# Patient Record
Sex: Female | Born: 1975 | Race: White | Hispanic: No | Marital: Married | Smoking: Never smoker
Health system: Southern US, Community
[De-identification: ages and names within clinical notes are randomized; demographics above are authoritative.]

## PROBLEM LIST (undated history)

## (undated) DIAGNOSIS — N200 Calculus of kidney: Secondary | ICD-10-CM

## (undated) DIAGNOSIS — IMO0001 Reserved for inherently not codable concepts without codable children: Secondary | ICD-10-CM

## (undated) DIAGNOSIS — N6019 Diffuse cystic mastopathy of unspecified breast: Secondary | ICD-10-CM

## (undated) DIAGNOSIS — B019 Varicella without complication: Secondary | ICD-10-CM

## (undated) DIAGNOSIS — R030 Elevated blood-pressure reading, without diagnosis of hypertension: Secondary | ICD-10-CM

## (undated) DIAGNOSIS — O09519 Supervision of elderly primigravida, unspecified trimester: Secondary | ICD-10-CM

## (undated) DIAGNOSIS — F411 Generalized anxiety disorder: Secondary | ICD-10-CM

## (undated) DIAGNOSIS — O0990 Supervision of high risk pregnancy, unspecified, unspecified trimester: Secondary | ICD-10-CM

## (undated) DIAGNOSIS — F1010 Alcohol abuse, uncomplicated: Secondary | ICD-10-CM

## (undated) HISTORY — DX: Generalized anxiety disorder: F41.1

## (undated) HISTORY — PX: KNEE SURGERY: SHX244

## (undated) HISTORY — DX: Varicella without complication: B01.9

## (undated) HISTORY — DX: Alcohol abuse, uncomplicated: F10.10

## (undated) HISTORY — DX: Elevated blood-pressure reading, without diagnosis of hypertension: R03.0

## (undated) HISTORY — DX: Reserved for inherently not codable concepts without codable children: IMO0001

## (undated) HISTORY — PX: WISDOM TOOTH EXTRACTION: SHX21

## (undated) HISTORY — DX: Calculus of kidney: N20.0

---
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
## 2003-02-13 HISTORY — PX: COLPOSCOPY: SHX161

## 2011-05-04 ENCOUNTER — Emergency Department: Payer: Self-pay | Admitting: Internal Medicine

## 2015-04-27 ENCOUNTER — Ambulatory Visit (INDEPENDENT_AMBULATORY_CARE_PROVIDER_SITE_OTHER): Payer: Managed Care, Other (non HMO) | Admitting: Primary Care

## 2015-04-27 ENCOUNTER — Encounter: Payer: Self-pay | Admitting: Primary Care

## 2015-04-27 VITALS — BP 104/64 | HR 62 | Temp 98.2°F | Ht 68.0 in | Wt 182.8 lb

## 2015-04-27 DIAGNOSIS — K219 Gastro-esophageal reflux disease without esophagitis: Secondary | ICD-10-CM | POA: Diagnosis not present

## 2015-04-27 DIAGNOSIS — F411 Generalized anxiety disorder: Secondary | ICD-10-CM | POA: Insufficient documentation

## 2015-04-27 DIAGNOSIS — G4700 Insomnia, unspecified: Secondary | ICD-10-CM | POA: Insufficient documentation

## 2015-04-27 MED ORDER — TRAZODONE HCL 50 MG PO TABS: 25.0000 mg | ORAL_TABLET | Freq: Every evening | ORAL | Status: DC | PRN

## 2015-04-27 NOTE — Assessment & Plan Note (Signed)
History of during ETOH abuse in 2014. Has not tried to wean off. Will have her try to come off and use Pepcid PRN for breakthrough symptoms.

## 2015-04-27 NOTE — Progress Notes (Signed)
Subjective:    Patient ID: Cynthia MelnickKatharine S Diaz, female    DOB: [birth year], 40 y.o.   MRN: [id number]  HPI  [name] is a 40 year old female who presents today to establish care and discuss the problems mentioned below. Will obtain old records. Her last physical was 1 year ago.   1) Generalized Anxiety Disorder: Currently managed on Lexapro 10 mg, Buspar 10 mg TID, and Trazodone 50 mg HS. She hardly takes her Buspar as she doesn't feel as though she needs it. She has a prescription for propranolol 20 mg that she's been taking for previous diagnosis of HTN and has found it to have a calming effect. She is taking 10 mg in the morning and 10 mg in the evening. Overall she feels well managed. Denies SI/HI. Her BP in the clinic today is on the lower end of normal. HR of 62.  2) Alcohol Abuse: History of in college years and young adulthood. Her drinking became out of control around 2013 as she would drink only vodka and wasn't eating. She was hospitalized twice in 2014 and went through rehab in 2014. After rehab in 2014 she has been well since. She's been sober for 3 years to date.   During her rehabilitation she was provided with a script for gabapentin due to neuropathy during detox. She was on high doses but is now on 200 mg daily (100 in the morning and 100 in the evening). She does occasionally experience numbness/tingling to her feet.  3) GERD: Managed on omeprazole 20 mg since 2014 during alcohol treatment. She does not have acid reflux symptoms on her medication and hasn't for years. She's not tried to wean off in the past.  Review of Systems  Constitutional: Negative for unexpected weight change.  Respiratory: Negative for shortness of breath.   Cardiovascular: Negative for chest pain.  Musculoskeletal: Negative for myalgias and arthralgias.  Neurological: Positive for numbness. Negative for dizziness and headaches.  Psychiatric/Behavioral:       See HPI       No past medical  history on file.  Social History   Social History  . Marital Status: Single    Spouse Name: N/A  . Number of Children: N/A  . Years of Education: N/A   Occupational History  . Not on file.   Social History Main Topics  . Smoking status: Never Smoker   . Smokeless tobacco: Not on file  . Alcohol Use: No  . Drug Use: No  . Sexual Activity: Not on file   Other Topics Concern  . 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   Enjoys exercising, running, playing soccer, art, gardening.    Past Surgical History  Procedure Laterality Date  . Knee surgery Left   . Wisdom tooth extraction      Family History  Problem Relation Age of Onset  . Arthritis Father   . Hyperlipidemia Father   . Hypertension Father   . Breast cancer Maternal Aunt     Allergies  Allergen Reactions  . Amoxicillin Hives  . Penicillins Hives    No current outpatient prescriptions on file prior to visit.   No current facility-administered medications on file prior to visit.    BP 104/64 mmHg  Pulse 62  Temp(Src) 98.2 F (36.8 C) (Oral)  Ht 5\' 8"  (1.727 m)  Wt 182 lb 12.8 oz (82.918 kg)  BMI 27.80 kg/m2  SpO2 98%  LMP 04/27/2015    Objective:   Physical Exam  Constitutional: She appears well-nourished.  Neck: Neck supple.  Cardiovascular: Normal rate and regular rhythm.   Pulmonary/Chest: Effort normal and breath sounds normal.  Skin: Skin is warm and dry.  Psychiatric: She has a normal mood and affect.          Assessment & Plan:

## 2015-04-27 NOTE — Patient Instructions (Addendum)
We will slowly wean you off of your propranolol. Take 1/4 tablet twice daily for 1 week, then 1/4 tablet once daily for 1 week, then stop.  Use the Buspar as needed for breakthrough anxiety.   Continue Lexapro daily.  Try to stop Omeprazole. For breakthrough acid reflux try Pepcid or Zantac as needed.  I sent refills for Trazodone to your pharmacy.  Please schedule a physical with me within the next 3 months. You may also schedule a lab only appointment 3-4 days prior. We will discuss your lab results in detail during your physical.  It was a pleasure to meet you today! Please don't hesitate to call me with any questions. Welcome to [facility]!

## 2015-04-27 NOTE — Progress Notes (Signed)
Pre visit review using our clinic review tool, if applicable. No additional management support is needed unless otherwise documented below in the visit note. 

## 2015-04-27 NOTE — Assessment & Plan Note (Signed)
Increased stress with wedding planning and is managed on Trazodone HS PRN. She feels this medication helps with sleep and would like refills. Refill provided.

## 2015-04-27 NOTE — Assessment & Plan Note (Signed)
Managed on Lexapro daily and Buspar TID PRN. Doesn't take the Buspar but does use Propranolol BID scheduled. Since HR and BP are on the lower end of normal, will have her wean of propranolol and use Buspar PRN for breakthrough anxiety.  Continue Lexapro.

## 2015-05-03 ENCOUNTER — Other Ambulatory Visit: Payer: Self-pay | Admitting: Primary Care

## 2015-05-03 DIAGNOSIS — Z0000 Encounter for general adult medical examination without abnormal findings: Secondary | ICD-10-CM

## 2015-05-04 ENCOUNTER — Other Ambulatory Visit (INDEPENDENT_AMBULATORY_CARE_PROVIDER_SITE_OTHER): Payer: Managed Care, Other (non HMO)

## 2015-05-04 DIAGNOSIS — Z0000 Encounter for general adult medical examination without abnormal findings: Secondary | ICD-10-CM | POA: Diagnosis not present

## 2015-05-04 LAB — LIPID PANEL
CHOL/HDL RATIO: 3
CHOLESTEROL: 160 mg/dL (ref 0–200)
HDL: 51.3 mg/dL (ref >39.00)
LDL CALC: 95 mg/dL (ref 0–99)
NonHDL: 109.18
TRIGLYCERIDES: 71 mg/dL (ref 0.0–149.0)
VLDL: 14.2 mg/dL (ref 0.0–40.0)

## 2015-05-04 LAB — COMPREHENSIVE METABOLIC PANEL
ALT: 11 U/L (ref 0–35)
AST: 15 U/L (ref 0–37)
Albumin: 4.4 g/dL (ref 3.5–5.2)
Alkaline Phosphatase: 71 U/L (ref 39–117)
BUN: 8 mg/dL (ref 6–23)
CO2: 31 meq/L (ref 19–32)
Calcium: 9.6 mg/dL (ref 8.4–10.5)
Chloride: 101 mEq/L (ref 96–112)
Creatinine, Ser: 0.91 mg/dL (ref 0.40–1.20)
GFR: 72.92 mL/min (ref >60.00)
GLUCOSE: 90 mg/dL (ref 70–99)
POTASSIUM: 3.9 meq/L (ref 3.5–5.1)
Sodium: 138 mEq/L (ref 135–145)
TOTAL PROTEIN: 7.4 g/dL (ref 6.0–8.3)
Total Bilirubin: 0.5 mg/dL (ref 0.2–1.2)

## 2015-05-04 LAB — CBC
HEMATOCRIT: 39.8 % (ref 36.0–46.0)
HEMOGLOBIN: 13.4 g/dL (ref 12.0–15.0)
MCHC: 33.8 g/dL (ref 30.0–36.0)
MCV: 89.3 fl (ref 78.0–100.0)
Platelets: 301 10*3/uL (ref 150.0–400.0)
RBC: 4.45 Mil/uL (ref 3.87–5.11)
RDW: 12.7 % (ref 11.5–15.5)
WBC: 6.2 10*3/uL (ref 4.0–10.5)

## 2015-05-04 LAB — VITAMIN D 25 HYDROXY (VIT D DEFICIENCY, FRACTURES): VITD: 39.78 ng/mL (ref 30.00–100.00)

## 2015-05-04 LAB — TSH: TSH: 1.5 u[IU]/mL (ref 0.35–4.50)

## 2015-05-04 LAB — HEMOGLOBIN A1C: Hgb A1c MFr Bld: 5.7 % (ref 4.6–6.5)

## 2015-05-16 ENCOUNTER — Encounter: Payer: Self-pay | Admitting: Primary Care

## 2015-05-16 ENCOUNTER — Ambulatory Visit (INDEPENDENT_AMBULATORY_CARE_PROVIDER_SITE_OTHER): Payer: Managed Care, Other (non HMO) | Admitting: Primary Care

## 2015-05-16 VITALS — BP 106/70 | HR 52 | Temp 98.1°F | Ht 68.0 in | Wt 183.8 lb

## 2015-05-16 DIAGNOSIS — F411 Generalized anxiety disorder: Secondary | ICD-10-CM | POA: Diagnosis not present

## 2015-05-16 DIAGNOSIS — Z23 Encounter for immunization: Secondary | ICD-10-CM

## 2015-05-16 DIAGNOSIS — H60543 Acute eczematoid otitis externa, bilateral: Secondary | ICD-10-CM | POA: Diagnosis not present

## 2015-05-16 DIAGNOSIS — H60549 Acute eczematoid otitis externa, unspecified ear: Secondary | ICD-10-CM | POA: Insufficient documentation

## 2015-05-16 DIAGNOSIS — Z01419 Encounter for gynecological examination (general) (routine) without abnormal findings: Secondary | ICD-10-CM

## 2015-05-16 DIAGNOSIS — Z0000 Encounter for general adult medical examination without abnormal findings: Secondary | ICD-10-CM | POA: Insufficient documentation

## 2015-05-16 MED ORDER — TRIAMCINOLONE ACETONIDE 0.1 % EX CREA: 1.0000 "application " | TOPICAL_CREAM | Freq: Two times a day (BID) | CUTANEOUS | Status: DC

## 2015-05-16 NOTE — Progress Notes (Signed)
Subjective:    Patient ID: Cynthia Diaz, female    DOB: 1975-05-21, 40 y.o.   MRN: [id number]  HPI  Cynthia Diaz is a 40 year old female who presents today for complete physical.  Immunizations: -Tetanus: Unsure, believes it was over 10 years.  -Influenza: Did not receive. Never completed.   Diet: Endorses a healthy diet. Breakfast: Skips, coffee Lunch: Salad, sushi Dinner: Soups, meat, vegetables Snacks: Pretzels, humus, vegetables and dip, crackers Desserts: Candy, some ice cream, cookies 3-4 times weekly.  Beverages: Water, flavored water  Exercise: Works out 4-5 times weekly for 1-2 hours.  Eye exam: Completed in 2016 Dental exam: Has not completed in several years.  Pap Smear: 1-2 years ago. Normal paps.    Review of Systems  Constitutional: Negative for unexpected weight change.  HENT: Negative for rhinorrhea.   Respiratory: Negative for cough and shortness of breath.   Cardiovascular: Negative for chest pain.  Gastrointestinal: Negative for diarrhea and constipation.  Genitourinary: Negative for difficulty urinating.  Musculoskeletal: Negative for myalgias.       Aching to both knees  Skin:       Eczema to ear canals.   Allergic/Immunologic: Negative for environmental allergies.  Neurological: Negative for dizziness, numbness and headaches.  Psychiatric/Behavioral:       Stable on current regimen.       Past Medical History  Diagnosis Date  . Alcohol abuse   . Chicken pox   . Elevated blood pressure   . Kidney stones   . Generalized anxiety disorder     Social History   Social History  . Marital Status: Single    Spouse Name: N/A  . Number of Children: N/A  . Years of Education: N/A   Occupational History  . Not on file.   Social History Main Topics  . Smoking status: Never Smoker   . Smokeless tobacco: Not on file  . Alcohol Use: No  . Drug Use: No  . Sexual Activity: Not on file   Other Topics Concern  . Not on file   Social  History Narrative   Engaged to be married.   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   Enjoys exercising, running, playing soccer, art, gardening.    Past Surgical History  Procedure Laterality Date  . Knee surgery Left   . Wisdom tooth extraction      Family History  Problem Relation Age of Onset  . Arthritis Father   . Hyperlipidemia Father   . Hypertension Father   . Breast cancer Maternal Aunt     Allergies  Allergen Reactions  . Amoxicillin Hives  . Penicillins Hives    Current Outpatient Prescriptions on File Prior to Visit  Medication Sig Dispense Refill  . busPIRone (BUSPAR) 10 MG tablet Take 10 mg by mouth 3 (three) times daily.    Marland Kitchen escitalopram (LEXAPRO) 10 MG tablet Take 10 mg by mouth daily.    Marland Kitchen gabapentin (NEURONTIN) 100 MG capsule Take 200 mg by mouth 3 (three) times daily.    Marland Kitchen omeprazole (PRILOSEC) 20 MG capsule Take 20 mg by mouth daily.    . propranolol (INDERAL) 20 MG tablet Take 1/2 tablet by mouth twice daily as needed for anxiety    . traZODone (DESYREL) 50 MG tablet Take 0.5-1 tablets (25-50 mg total) by mouth at bedtime as needed for sleep. 30 tablet 4   No current facility-administered medications on file prior to visit.    BP 106/70 mmHg  Pulse 52  Temp(Src) 98.1 F (36.7 C) (Oral)  Ht 5\' 8"  (1.727 m)  Wt 183 lb 12.8 oz (83.371 kg)  BMI 27.95 kg/m2  SpO2 97%  LMP 04/27/2015    Objective:   Physical Exam  Constitutional: She is oriented to person, place, and time. She appears well-nourished.  HENT:  Right Ear: Tympanic membrane and ear canal normal.  Left Ear: Tympanic membrane and ear canal normal.  Nose: Nose normal.  Mouth/Throat: Oropharynx is clear and moist.  Eyes: Conjunctivae and EOM are normal. Pupils are equal, round, and reactive to light.  Neck: Neck supple. No thyromegaly present.  Cardiovascular: Normal rate and regular rhythm.   No murmur heard. Pulmonary/Chest: Effort normal and breath sounds normal. She has no  rales.  Abdominal: Soft. Bowel sounds are normal. There is no tenderness.  Musculoskeletal: Normal range of motion.  Lymphadenopathy:    She has no cervical adenopathy.  Neurological: She is alert and oriented to person, place, and time. She has normal reflexes. No cranial nerve deficit.  Skin: Skin is warm and dry. No rash noted.  Psychiatric: She has a normal mood and affect.          Assessment & Plan:

## 2015-05-16 NOTE — Addendum Note (Signed)
Addended by: Tawnya CrookSAMBATH, Eathon Valade on: 05/16/2015 03:32 PM   Modules accepted: Orders, SmartSet

## 2015-05-16 NOTE — Assessment & Plan Note (Signed)
History of for years. Once managed on triamcinolone, out of med. Refill provided. Mild eczema noted on exam.

## 2015-05-16 NOTE — Patient Instructions (Addendum)
I've sent Triamcinolone cream to your pharmacy to use on your ears. Apply small amount to ears twice daily as needed for itching/irritation.  Start taking a multivitamin to help increase vitamin D levels.  You will be contacted regarding your referral to GYN.  Please let us know if you have not heard back within one week. You can also call [facility] to ask about establishing with Dr. Helmut MusterAlicia Copland.  Your labs look great! Continue your healthy lifestyle as discussed.   Wean off propranolol as discussed as your heart rate is too low.  Start taking Buspar 1/2 tablet twice daily. Continue Lexapro.   Follow up in 6 months for re-evaluation of anxiety.   It was a pleasure to see you today!

## 2015-05-16 NOTE — Assessment & Plan Note (Signed)
Continues taking propranolol, HR today of 52. Discussed importance of weaning off. Will have her start taking Buspar twice daily in addition to Lexapro.  Follow up in 6 months.

## 2015-05-16 NOTE — Assessment & Plan Note (Signed)
Tdap due, provided today. Never get flu shots. Pap UTD, would like to establish with GYN, referral placed. Exam unremarkable. Labs unremarkable. Continue healthy lifestyle, recommended she eat more during meals.  Follow up in 1 year for repeat physical.

## 2015-05-16 NOTE — Progress Notes (Signed)
Pre visit review using our clinic review tool, if applicable. No additional management support is needed unless otherwise documented below in the visit note. 

## 2015-06-27 ENCOUNTER — Other Ambulatory Visit: Payer: Self-pay | Admitting: Primary Care

## 2015-06-27 DIAGNOSIS — F411 Generalized anxiety disorder: Secondary | ICD-10-CM

## 2015-06-27 DIAGNOSIS — G629 Polyneuropathy, unspecified: Secondary | ICD-10-CM

## 2015-06-28 NOTE — Telephone Encounter (Signed)
Electronically refill request. Medications have not been prescribed by Jae DireKate. Last seen for CPE on 05/16/2015. Follow up on 11/15/2015.

## 2015-06-29 NOTE — Addendum Note (Signed)
Addended by: Tawnya CrookSAMBATH, Nikolis Berent on: 06/29/2015 04:38 PM   Modules accepted: Orders

## 2015-06-29 NOTE — Telephone Encounter (Signed)
Called and notified patient of Kate's comments. Patient stated she is not taking propranolol and omeprazole. Pharmacy was not suppose to sent those for refills.  However, patient ask if she can get a refill on the buspirone.

## 2015-06-30 ENCOUNTER — Other Ambulatory Visit: Payer: Self-pay | Admitting: Primary Care

## 2015-06-30 MED ORDER — BUSPIRONE HCL 10 MG PO TABS: 10.0000 mg | ORAL_TABLET | Freq: Two times a day (BID) | ORAL | Status: DC

## 2015-07-01 NOTE — Telephone Encounter (Signed)
Called and notified patient of Kate's comments. Patient stated that she is taking 1/2 tablet 2 times a daily.

## 2015-08-05 ENCOUNTER — Telehealth: Payer: Self-pay | Admitting: Primary Care

## 2015-08-05 NOTE — Telephone Encounter (Signed)
Patient reached out via our website with a question about her blood type.  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 I wanted to check and see if you guys could let me know. That would be fantastic and I thank you so much :)

## 2015-08-05 NOTE — Telephone Encounter (Signed)
Please advise 

## 2015-08-05 NOTE — Telephone Encounter (Signed)
No answer and mailbox is full so unable to leave message.

## 2015-08-05 NOTE — Telephone Encounter (Signed)
This is not something that we routinely check so I have not done so. But with my experience in donating blood they will be happy to check your blood type at the donation center, before you donate, if you do not know your blood type. If you prefer we check your blood type I am happy to do so but this may not be covered by insurance. The donation center should be able to check free of charge.

## 2015-08-08 NOTE — Telephone Encounter (Signed)
Spoken and notified patient of Kate's comments. Patient verbalized understanding. 

## 2015-08-08 NOTE — Telephone Encounter (Signed)
Tried to call patient and could not leave message due to mailbox is full.

## 2015-08-08 NOTE — Telephone Encounter (Signed)
Returned your call.

## 2015-08-26 ENCOUNTER — Other Ambulatory Visit: Payer: Self-pay | Admitting: Primary Care

## 2015-08-29 NOTE — Telephone Encounter (Signed)
Electronically refill request for   cyanocobalamin (,VITAMIN B-12,) 1000 MCG/ML injection    Sig: INJECT 1 ML INTRAMUSCULARLY EVERY 2 WEEKS   Dispense: 6 mL   Refills: 3     Medication have not been prescribed by Jae DireKate. Also is not on patient's current medication list. Last seen on 05/16/2015. Follow up on 11/15/2015.

## 2015-09-05 ENCOUNTER — Encounter: Payer: Self-pay | Admitting: Primary Care

## 2015-09-06 ENCOUNTER — Other Ambulatory Visit: Payer: Self-pay | Admitting: Primary Care

## 2015-09-06 DIAGNOSIS — E538 Deficiency of other specified B group vitamins: Secondary | ICD-10-CM

## 2015-10-24 ENCOUNTER — Other Ambulatory Visit: Payer: Self-pay | Admitting: Primary Care

## 2015-10-24 DIAGNOSIS — G4700 Insomnia, unspecified: Secondary | ICD-10-CM

## 2015-11-15 ENCOUNTER — Ambulatory Visit (INDEPENDENT_AMBULATORY_CARE_PROVIDER_SITE_OTHER): Payer: Managed Care, Other (non HMO) | Admitting: Primary Care

## 2015-11-15 ENCOUNTER — Encounter: Payer: Self-pay | Admitting: Primary Care

## 2015-11-15 VITALS — BP 104/64 | HR 64 | Temp 98.2°F | Ht 68.0 in | Wt 169.8 lb

## 2015-11-15 DIAGNOSIS — H60543 Acute eczematoid otitis externa, bilateral: Secondary | ICD-10-CM

## 2015-11-15 DIAGNOSIS — K219 Gastro-esophageal reflux disease without esophagitis: Secondary | ICD-10-CM

## 2015-11-15 DIAGNOSIS — E538 Deficiency of other specified B group vitamins: Secondary | ICD-10-CM

## 2015-11-15 DIAGNOSIS — R7303 Prediabetes: Secondary | ICD-10-CM | POA: Diagnosis not present

## 2015-11-15 DIAGNOSIS — G4700 Insomnia, unspecified: Secondary | ICD-10-CM

## 2015-11-15 DIAGNOSIS — F411 Generalized anxiety disorder: Secondary | ICD-10-CM

## 2015-11-15 LAB — BASIC METABOLIC PANEL
BUN: 13 mg/dL (ref 6–23)
CHLORIDE: 100 meq/L (ref 96–112)
CO2: 32 meq/L (ref 19–32)
Calcium: 9.9 mg/dL (ref 8.4–10.5)
Creatinine, Ser: 0.9 mg/dL (ref 0.40–1.20)
GFR: 73.66 mL/min (ref >60.00)
GLUCOSE: 98 mg/dL (ref 70–99)
POTASSIUM: 3.9 meq/L (ref 3.5–5.1)
Sodium: 139 mEq/L (ref 135–145)

## 2015-11-15 LAB — VITAMIN B12: Vitamin B-12: 648 pg/mL (ref 211–911)

## 2015-11-15 LAB — HEMOGLOBIN A1C: Hgb A1c MFr Bld: 5.5 % (ref 4.6–6.5)

## 2015-11-15 NOTE — Assessment & Plan Note (Signed)
Stress at work recently, able to manage. Feels well managed on BuSpar and Lexapro. Has not used propanolol in several months.

## 2015-11-15 NOTE — Assessment & Plan Note (Signed)
Has not taken omeprazole in several months given lack of symptoms. Discussed to use Zantac or Pepcid if symptoms return.

## 2015-11-15 NOTE — Progress Notes (Signed)
Subjective:    Patient ID: Cynthia MelnickKatharine S Diaz, female    DOB: 1975/11/12, 40 y.o.   MRN: [id number]  HPI  [name] is a 40 year old female who presents today for follow up.  1) GAD: Currently managed on Buspar 10 mg BID, Lexapro 10 mg. She has not taken her Propranolol in several months. She is running regularly which has helped with her anxiety. She has felt stressed at work but is able to manage well on her own. She has felt days of increased fatigue, some days worse than others. She's not taken her B12 tablets recently and is due for repeat vitamin B12 level. She has lost 20 pounds since her last visit through healthy diet and exercise. Overall she feels well managed on current regimen. Denies SI/HI.  2) Insomnia: Currently managed on Trazodone 50 mg. She is sleeping 8-9 hours of during the night. She does not wake through the night typically. She feels well managed on trazodone.  3) GERD: Previously managed on omeprazole but has not taken several months given lack of symptoms. Weight loss of 20 pounds since last visit through healthy diet and regular exercise. She continues to remain sober.  Review of Systems  Respiratory: Negative for shortness of breath.   Cardiovascular: Negative for chest pain.  Psychiatric/Behavioral: Negative for sleep disturbance and suicidal ideas. The patient is not nervous/anxious.        Stress at work, able to manage well dependently.       Past Medical History:  Diagnosis Date  . Alcohol abuse   . Chicken pox   . Elevated blood pressure   . Generalized anxiety disorder   . Kidney stones      Social History   Social History  . Marital status: Single    Spouse name: N/A  . Number of children: N/A  . Years of education: N/A   Occupational History  . Not on file.   Social History Main Topics  . Smoking status: Never Smoker  . Smokeless tobacco: Not on file  . Alcohol use No  . Drug use: No  . Sexual activity: Not on file   Other  Topics Concern  . 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   Enjoys exercising, running, playing soccer, art, gardening.    Past Surgical History:  Procedure Laterality Date  . KNEE SURGERY Left   . WISDOM TOOTH EXTRACTION      Family History  Problem Relation Age of Onset  . Arthritis Father   . Hyperlipidemia Father   . Hypertension Father   . Breast cancer Maternal Aunt     Allergies  Allergen Reactions  . Amoxicillin Hives  . Penicillins Hives    Current Outpatient Prescriptions on File Prior to Visit  Medication Sig Dispense Refill  . busPIRone (BUSPAR) 10 MG tablet Take 1 tablet (10 mg total) by mouth 2 (two) times daily. 60 tablet 5  . escitalopram (LEXAPRO) 10 MG tablet TAKE 1 TABLET BY MOUTH ONCE DAILY 90 tablet 3  . gabapentin (NEURONTIN) 100 MG capsule TAKE 2 CAPSULES BY MOUTH TWICE A DAY FOR NEUROPATHY 360 capsule 1  . traZODone (DESYREL) 50 MG tablet TAKE 0.5-1 TABLETS (25-50 MG TOTAL) BY MOUTH AT BEDTIME AS NEEDED FOR SLEEP. 30 tablet 1  . propranolol (INDERAL) 20 MG tablet Take 1/2 tablet by mouth twice daily as needed for anxiety    .  triamcinolone cream (KENALOG) 0.1 % Apply 1 application topically 2 (two) times daily. (Patient not taking: Reported on 11/15/2015) 30 g 0   No current facility-administered medications on file prior to visit.     BP 104/64   Pulse 64   Temp 98.2 F (36.8 C) (Oral)   Ht 5\' 8"  (1.727 m)   Wt 169 lb 12.8 oz (77 kg)   LMP 11/10/2015   SpO2 98%   BMI 25.82 kg/m    Objective:   Physical Exam  Constitutional: She appears well-nourished.  Cardiovascular: Normal rate and regular rhythm.   Pulmonary/Chest: Effort normal and breath sounds normal.  Skin: Skin is warm and dry.  Psychiatric: She has a normal mood and affect.          Assessment & Plan:

## 2015-11-15 NOTE — Progress Notes (Signed)
Pre visit review using our clinic review tool, if applicable. No additional management support is needed unless otherwise documented below in the visit note. 

## 2015-11-15 NOTE — Patient Instructions (Addendum)
Complete lab work prior to leaving today. I will notify you of your results once received.   Continue Lexapro, Buspar, and Trazodone as discussed.  Follow up in April 2018 for your annual physical exam or sooner if needed.  It was a pleasure to see you today!

## 2015-11-15 NOTE — Assessment & Plan Note (Signed)
Intermittent. Stable on triamcinolone cream.

## 2015-11-15 NOTE — Assessment & Plan Note (Signed)
Stable on trazodone, continue current regimen 

## 2016-02-13 ENCOUNTER — Other Ambulatory Visit: Payer: Self-pay | Admitting: Primary Care

## 2016-02-13 DIAGNOSIS — G4700 Insomnia, unspecified: Secondary | ICD-10-CM

## 2016-02-13 NOTE — L&D Delivery Note (Signed)
Operative Delivery Note  I was called to the room at 18:04 and arrived shortly after. The patient was complete and pushing with nursing. I was told the heart tones had been down for a while. I examined the patient, she was complete, +2 station. Fetal Heart tone were 90-120. I verbally consented the patient for a vacuum delivery.  Risks and benefits discussed.  Risks include, but are not limited to the risks of anesthesia, bleeding, infection, damage to maternal tissues, fetal cephalhematoma.  There is also the risk of inability to effect vaginal delivery of the head, or shoulder dystocia that cannot be resolved by established maneuvers, leading to the need for emergency cesarean section. She verbally agreed. The vacuum was placed at (+2) station, (LOA) position. Bladder was empty. Anesthesia was found to be adequate. Two pulls were performed with maternal pushing assistance. No pop offs. The vacuum suction was reduced between pulls. Infant was delivered after two pulls without complication. The vacuum was removed. LOA position. The anterior shoulder was delivered with gentle downward guidance. The posterior shoulder was delivered with upward guidance. The infant was placed on maternal chest. The baby was vigorous and cried. Delayed cord clamping was performed.The infants head/face was examined and no bruising noted. The placenta delivered spontaneous and intact. 3 vessel cord. Uterine fundus was firm. Patient had bilateral sulcus and a 2nd degree perineal lacerations. Lacerations were repaired with 2-0 vicryl and 0-Chromic. Persistent bleeding from right sulcus. Vagina packed and pressure applied.    APGAR: 8, 9; weight 6 lb 15 oz (3147 g).   Cord pH: 7.11  Anesthesia:  Epidural Instruments: Kiwi Episiotomy: None Lacerations: 2nd degree Suture Repair: 2.0 vicryl and 0-chromic Est. Blood Loss (mL):  565cc  Cynthia Diaz R Cynthia Diaz 01/20/2017, 8:52 PM

## 2016-02-14 NOTE — Telephone Encounter (Signed)
Ok to refill? Electronically refill request for   traZODone (DESYREL) 50 MG tablet  Last prescribed on 10/24/2015. Last seen on 11/15/2015.

## 2016-03-15 ENCOUNTER — Encounter: Payer: Self-pay | Admitting: Primary Care

## 2016-03-16 ENCOUNTER — Telehealth: Payer: Managed Care, Other (non HMO) | Admitting: Nurse Practitioner

## 2016-03-16 DIAGNOSIS — J111 Influenza due to unidentified influenza virus with other respiratory manifestations: Secondary | ICD-10-CM

## 2016-03-16 MED ORDER — OSELTAMIVIR PHOSPHATE 75 MG PO CAPS: 75.0000 mg | ORAL_CAPSULE | Freq: Two times a day (BID) | ORAL | 0 refills | Status: DC

## 2016-03-16 NOTE — Progress Notes (Signed)

## 2016-04-25 ENCOUNTER — Other Ambulatory Visit: Payer: Self-pay | Admitting: Primary Care

## 2016-04-25 ENCOUNTER — Encounter: Payer: Self-pay | Admitting: Primary Care

## 2016-04-25 DIAGNOSIS — F411 Generalized anxiety disorder: Secondary | ICD-10-CM

## 2016-04-25 DIAGNOSIS — G629 Polyneuropathy, unspecified: Secondary | ICD-10-CM

## 2016-04-25 MED ORDER — BUSPIRONE HCL 10 MG PO TABS: 10.0000 mg | ORAL_TABLET | Freq: Two times a day (BID) | ORAL | 1 refills | Status: DC

## 2016-04-25 MED ORDER — GABAPENTIN 100 MG PO CAPS: ORAL_CAPSULE | ORAL | 1 refills | Status: DC

## 2016-05-04 ENCOUNTER — Other Ambulatory Visit: Payer: Self-pay | Admitting: Primary Care

## 2016-05-04 DIAGNOSIS — Z0000 Encounter for general adult medical examination without abnormal findings: Secondary | ICD-10-CM

## 2016-05-09 ENCOUNTER — Other Ambulatory Visit (INDEPENDENT_AMBULATORY_CARE_PROVIDER_SITE_OTHER): Payer: BLUE CROSS/BLUE SHIELD

## 2016-05-09 DIAGNOSIS — Z0000 Encounter for general adult medical examination without abnormal findings: Secondary | ICD-10-CM

## 2016-05-09 LAB — COMPREHENSIVE METABOLIC PANEL
ALT: 8 U/L (ref 0–35)
AST: 12 U/L (ref 0–37)
Albumin: 4.5 g/dL (ref 3.5–5.2)
Alkaline Phosphatase: 54 U/L (ref 39–117)
BUN: 13 mg/dL (ref 6–23)
CALCIUM: 9.9 mg/dL (ref 8.4–10.5)
CHLORIDE: 104 meq/L (ref 96–112)
CO2: 30 meq/L (ref 19–32)
CREATININE: 0.88 mg/dL (ref 0.40–1.20)
GFR: 75.41 mL/min (ref >60.00)
Glucose, Bld: 105 mg/dL — ABNORMAL HIGH (ref 70–99)
Potassium: 4.3 mEq/L (ref 3.5–5.1)
Sodium: 140 mEq/L (ref 135–145)
Total Bilirubin: 0.4 mg/dL (ref 0.2–1.2)
Total Protein: 6.8 g/dL (ref 6.0–8.3)

## 2016-05-09 LAB — CBC
HCT: 39.5 % (ref 36.0–46.0)
Hemoglobin: 13.2 g/dL (ref 12.0–15.0)
MCHC: 33.5 g/dL (ref 30.0–36.0)
MCV: 91 fl (ref 78.0–100.0)
Platelets: 240 10*3/uL (ref 150.0–400.0)
RBC: 4.34 Mil/uL (ref 3.87–5.11)
RDW: 12.9 % (ref 11.5–15.5)
WBC: 4.8 10*3/uL (ref 4.0–10.5)

## 2016-05-09 LAB — LIPID PANEL
CHOL/HDL RATIO: 3
Cholesterol: 157 mg/dL (ref 0–200)
HDL: 62 mg/dL (ref >39.00)
LDL CALC: 85 mg/dL (ref 0–99)
NonHDL: 94.67
TRIGLYCERIDES: 50 mg/dL (ref 0.0–149.0)
VLDL: 10 mg/dL (ref 0.0–40.0)

## 2016-05-16 ENCOUNTER — Encounter: Payer: Self-pay | Admitting: Primary Care

## 2016-05-16 ENCOUNTER — Encounter: Payer: BLUE CROSS/BLUE SHIELD | Admitting: Primary Care

## 2016-05-16 ENCOUNTER — Ambulatory Visit (INDEPENDENT_AMBULATORY_CARE_PROVIDER_SITE_OTHER): Payer: BLUE CROSS/BLUE SHIELD | Admitting: Primary Care

## 2016-05-16 VITALS — BP 122/72 | HR 65 | Temp 98.2°F | Ht 68.0 in | Wt 160.1 lb

## 2016-05-16 DIAGNOSIS — Z0000 Encounter for general adult medical examination without abnormal findings: Secondary | ICD-10-CM

## 2016-05-16 DIAGNOSIS — H60543 Acute eczematoid otitis externa, bilateral: Secondary | ICD-10-CM | POA: Diagnosis not present

## 2016-05-16 DIAGNOSIS — G4700 Insomnia, unspecified: Secondary | ICD-10-CM | POA: Diagnosis not present

## 2016-05-16 DIAGNOSIS — F411 Generalized anxiety disorder: Secondary | ICD-10-CM | POA: Diagnosis not present

## 2016-05-16 DIAGNOSIS — K219 Gastro-esophageal reflux disease without esophagitis: Secondary | ICD-10-CM

## 2016-05-16 NOTE — Progress Notes (Signed)
Pre visit review using our clinic review tool, if applicable. No additional management support is needed unless otherwise documented below in the visit note. 

## 2016-05-16 NOTE — Assessment & Plan Note (Signed)
Overall stable on Buspar and Lexapro. Feeling overwhelmed at work but able to manage. Denies SI/HI.

## 2016-05-16 NOTE — Assessment & Plan Note (Signed)
Td UTD. Pap UTD, mammogram due, following with GYN and will schedule through GYN. Commended her on healthy lifestyle, continue. Exam unremarkable. Labs unremarkable. Follow up in 1 year for annual exam.

## 2016-05-16 NOTE — Assessment & Plan Note (Signed)
No use of PPI or H2 Blocker in months. Follows healthy diet.

## 2016-05-16 NOTE — Progress Notes (Signed)
Subjective:    Patient ID: Cynthia Diaz, female    DOB: 07-04-1975, 41 y.o.   MRN: [id number]  HPI  Cynthia Diaz is a 41 year old female who presents today for complete physical. Overall doing well, has no complaints today.   Immunizations: -Tetanus: Completed in 2017 -Influenza: Did not complete last season   Diet: She endorses a healthy diet. Breakfast: Skips, coffee, breakfast bar Lunch: Salads Dinner: Meat, vegetables Snacks: Popcorn, pretzels  Desserts: 2-3 times weekly Beverages: Coffee, water, flavored water, sugar free propel, supplemental drink  Exercise: She exercises 3-4 times weekly at the gym Eye exam: Completed 1 year ago, due now. Dental exam: Plans on scheduling soon. Pap Smear: Following with GYN. Pap was in 2017. Mammogram: Never completed. Will schedule through GYN.   Review of Systems  Constitutional: Negative for unexpected weight change.  HENT: Negative for rhinorrhea.   Respiratory: Negative for cough and shortness of breath.   Cardiovascular: Negative for chest pain.  Gastrointestinal: Negative for constipation and diarrhea.  Genitourinary: Negative for difficulty urinating and menstrual problem.  Musculoskeletal: Negative for arthralgias and myalgias.  Skin: Negative for rash.  Allergic/Immunologic: Negative for environmental allergies.  Neurological: Negative for dizziness, numbness and headaches.  Psychiatric/Behavioral:       Overall feeling well managed on current regimen.        Past Medical History:  Diagnosis Date  . Alcohol abuse   . Chicken pox   . Elevated blood pressure   . Generalized anxiety disorder   . Kidney stones      Social History   Social History  . Marital status: Single    Spouse name: N/A  . Number of children: N/A  . Years of education: N/A   Occupational History  . Not on file.   Social History Main Topics  . Smoking status: Never Smoker  . Smokeless tobacco: Never Used  . Alcohol use No  . Drug  use: No  . Sexual activity: Not on file   Other Topics Concern  . 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   Enjoys exercising, running, playing soccer, art, gardening.    Past Surgical History:  Procedure Laterality Date  . KNEE SURGERY Left   . WISDOM TOOTH EXTRACTION      Family History  Problem Relation Age of Onset  . Arthritis Father   . Hyperlipidemia Father   . Hypertension Father   . Breast cancer Maternal Aunt     Allergies  Allergen Reactions  . Amoxicillin Hives  . Penicillins Hives    Current Outpatient Prescriptions on File Prior to Visit  Medication Sig Dispense Refill  . busPIRone (BUSPAR) 10 MG tablet Take 1 tablet (10 mg total) by mouth 2 (two) times daily. 180 tablet 1  . escitalopram (LEXAPRO) 10 MG tablet TAKE 1 TABLET BY MOUTH ONCE DAILY 90 tablet 3  . gabapentin (NEURONTIN) 100 MG capsule TAKE 2 CAPSULES BY MOUTH TWICE A DAY FOR NEUROPATHY 360 capsule 1  . propranolol (INDERAL) 20 MG tablet Take 1/2 tablet by mouth twice daily as needed for anxiety    . traZODone (DESYREL) 50 MG tablet TAKE 0.5-1 TABLETS (25-50 MG TOTAL) BY MOUTH AT BEDTIME AS NEEDED FOR SLEEP. 90 tablet 0  . triamcinolone cream (KENALOG) 0.1 % Apply 1 application topically 2 (two) times daily. (Patient not taking: Reported on 05/16/2016) 30 g 0  No current facility-administered medications on file prior to visit.     BP 122/72   Pulse 65   Temp 98.2 F (36.8 C) (Oral)   Ht  (1.727 m)   Wt 160 lb 1.9 oz (72.6 kg)   LMP 05/16/2016   SpO2 98%   BMI 24.35 kg/m    Objective:   Physical Exam  Constitutional: She is oriented to person, place, and time. She appears well-nourished.  HENT:  Right Ear: Tympanic membrane and ear canal normal.  Left Ear: Tympanic membrane and ear canal normal.  Nose: Nose normal.  Mouth/Throat: Oropharynx is clear and moist.  Eyes: Conjunctivae and EOM are normal. Pupils  are equal, round, and reactive to light.  Neck: Neck supple. No thyromegaly present.  Cardiovascular: Normal rate and regular rhythm.   No murmur heard. Pulmonary/Chest: Effort normal and breath sounds normal. She has no rales.  Abdominal: Soft. Bowel sounds are normal. There is no tenderness.  Musculoskeletal: Normal range of motion.  Lymphadenopathy:    She has no cervical adenopathy.  Neurological: She is alert and oriented to person, place, and time. She has normal reflexes. No cranial nerve deficit.  Skin: Skin is warm and dry. No rash noted.  Psychiatric: She has a normal mood and affect.          Assessment & Plan:

## 2016-05-16 NOTE — Assessment & Plan Note (Signed)
Doing well on Trazodone. Continue same.

## 2016-05-16 NOTE — Assessment & Plan Note (Signed)
No recent flares 

## 2016-05-16 NOTE — Patient Instructions (Signed)
Continue your efforts towards a healthy lifestyle through diet and exercise.   Your labs look wonderful!  Message me through My Chart if you need anything.  Follow up in 1 year. It was a pleasure to see you today!

## 2016-05-21 ENCOUNTER — Other Ambulatory Visit: Payer: Self-pay | Admitting: Primary Care

## 2016-05-21 DIAGNOSIS — G4700 Insomnia, unspecified: Secondary | ICD-10-CM

## 2016-06-17 ENCOUNTER — Other Ambulatory Visit: Payer: Self-pay | Admitting: Primary Care

## 2016-06-17 DIAGNOSIS — F411 Generalized anxiety disorder: Secondary | ICD-10-CM

## 2016-06-23 ENCOUNTER — Other Ambulatory Visit: Payer: Self-pay | Admitting: Primary Care

## 2016-06-23 DIAGNOSIS — F411 Generalized anxiety disorder: Secondary | ICD-10-CM

## 2016-07-02 ENCOUNTER — Encounter: Payer: Self-pay | Admitting: Obstetrics and Gynecology

## 2016-07-02 ENCOUNTER — Ambulatory Visit (INDEPENDENT_AMBULATORY_CARE_PROVIDER_SITE_OTHER): Payer: BLUE CROSS/BLUE SHIELD | Admitting: Obstetrics and Gynecology

## 2016-07-02 DIAGNOSIS — A64 Unspecified sexually transmitted disease: Secondary | ICD-10-CM

## 2016-07-02 DIAGNOSIS — O09529 Supervision of elderly multigravida, unspecified trimester: Secondary | ICD-10-CM

## 2016-07-02 DIAGNOSIS — O09519 Supervision of elderly primigravida, unspecified trimester: Secondary | ICD-10-CM

## 2016-07-02 DIAGNOSIS — Z3A01 Less than 8 weeks gestation of pregnancy: Secondary | ICD-10-CM

## 2016-07-02 HISTORY — DX: Supervision of elderly primigravida, unspecified trimester: O09.519

## 2016-07-02 NOTE — Patient Instructions (Signed)
First Trimester of Pregnancy The first trimester of pregnancy is from week 1 until the end of week 13 (months 1 through 3). A week after a sperm fertilizes an egg, the egg will implant on the wall of the uterus. This embryo will begin to develop into a baby. Genes from you and your partner will form the baby. The female genes will determine whether the baby will be a boy or a girl. At 6-8 weeks, the eyes and face will be formed, and the heartbeat can be seen on ultrasound. At the end of 12 weeks, all the baby's organs will be formed. Now that you are pregnant, you will want to do everything you can to have a healthy baby. Two of the most important things are to get good prenatal care and to follow your health care provider's instructions. Prenatal care is all the medical care you receive before the baby's birth. This care will help prevent, find, and treat any problems during the pregnancy and childbirth. Body changes during your first trimester Your body goes through many changes during pregnancy. The changes vary from woman to woman.  You may gain or lose a couple of pounds at first.  You may feel sick to your stomach (nauseous) and you may throw up (vomit). If the vomiting is uncontrollable, call your health care provider.  You may tire easily.  You may develop headaches that can be relieved by medicines. All medicines should be approved by your health care provider.  You may urinate more often. Painful urination may mean you have a bladder infection.  You may develop heartburn as a result of your pregnancy.  You may develop constipation because certain hormones are causing the muscles that push stool through your intestines to slow down.  You may develop hemorrhoids or swollen veins (varicose veins).  Your breasts may begin to grow larger and become tender. Your nipples may stick out more, and the tissue that surrounds them (areola) may become darker.  Your gums may bleed and may be  sensitive to brushing and flossing.  Dark spots or blotches (chloasma, mask of pregnancy) may develop on your face. This will likely fade after the baby is born.  Your menstrual periods will stop.  You may have a loss of appetite.  You may develop cravings for certain kinds of food.  You may have changes in your emotions from day to day, such as being excited to be pregnant or being concerned that something may go wrong with the pregnancy and baby.  You may have more vivid and strange dreams.  You may have changes in your hair. These can include thickening of your hair, rapid growth, and changes in texture. Some women also have hair loss during or after pregnancy, or hair that feels dry or thin. Your hair will most likely return to normal after your baby is born.  What to expect at prenatal visits During a routine prenatal visit:  You will be weighed to make sure you and the baby are growing normally.  Your blood pressure will be taken.  Your abdomen will be measured to track your baby's growth.  The fetal heartbeat will be listened to between weeks 10 and 14 of your pregnancy.  Test results from any previous visits will be discussed.  Your health care provider may ask you:  How you are feeling.  If you are feeling the baby move.  If you have had any abnormal symptoms, such as leaking fluid, bleeding, severe headaches,   or abdominal cramping.  If you are using any tobacco products, including cigarettes, chewing tobacco, and electronic cigarettes.  If you have any questions.  Other tests that may be performed during your first trimester include:  Blood tests to find your blood type and to check for the presence of any previous infections. The tests will also be used to check for low iron levels (anemia) and protein on red blood cells (Rh antibodies). Depending on your risk factors, or if you previously had diabetes during pregnancy, you may have tests to check for high blood  sugar that affects pregnant women (gestational diabetes).  Urine tests to check for infections, diabetes, or protein in the urine.  An ultrasound to confirm the proper growth and development of the baby.  Fetal screens for spinal cord problems (spina bifida) and Down syndrome.  HIV (human immunodeficiency virus) testing. Routine prenatal testing includes screening for HIV, unless you choose not to have this test.  You may need other tests to make sure you and the baby are doing well.  Follow these instructions at home: Medicines  Follow your health care provider's instructions regarding medicine use. Specific medicines may be either safe or unsafe to take during pregnancy.  Take a prenatal vitamin that contains at least 600 micrograms (mcg) of folic acid.  If you develop constipation, try taking a stool softener if your health care provider approves. Eating and drinking  Eat a balanced diet that includes fresh fruits and vegetables, whole grains, good sources of protein such as meat, eggs, or tofu, and low-fat dairy. Your health care provider will help you determine the amount of weight gain that is right for you.  Avoid raw meat and uncooked cheese. These carry germs that can cause birth defects in the baby.  Eating four or five small meals rather than three large meals a day may help relieve nausea and vomiting. If you start to feel nauseous, eating a few soda crackers can be helpful. Drinking liquids between meals, instead of during meals, also seems to help ease nausea and vomiting.  Limit foods that are high in fat and processed sugars, such as fried and sweet foods.  To prevent constipation: ? Eat foods that are high in fiber, such as fresh fruits and vegetables, whole grains, and beans. ? Drink enough fluid to keep your urine clear or pale yellow. Activity  Exercise only as directed by your health care provider. Most women can continue their usual exercise routine during  pregnancy. Try to exercise for 30 minutes at least 5 days a week. Exercising will help you: ? Control your weight. ? Stay in shape. ? Be prepared for labor and delivery.  Experiencing pain or cramping in the lower abdomen or lower back is a good sign that you should stop exercising. Check with your health care provider before continuing with normal exercises.  Try to avoid standing for long periods of time. Move your legs often if you must stand in one place for a long time.  Avoid heavy lifting.  Wear low-heeled shoes and practice good posture.  You may continue to have sex unless your health care provider tells you not to. Relieving pain and discomfort  Wear a good support bra to relieve breast tenderness.  Take warm sitz baths to soothe any pain or discomfort caused by hemorrhoids. Use hemorrhoid cream if your health care provider approves.  Rest with your legs elevated if you have leg cramps or low back pain.  If you develop   varicose veins in your legs, wear support hose. Elevate your feet for 15 minutes, 3-4 times a day. Limit salt in your diet. Prenatal care  Schedule your prenatal visits by the twelfth week of pregnancy. They are usually scheduled monthly at first, then more often in the last 2 months before delivery.  Write down your questions. Take them to your prenatal visits.  Keep all your prenatal visits as told by your health care provider. This is important. Safety  Wear your seat belt at all times when driving.  Make a list of emergency phone numbers, including numbers for family, friends, the hospital, and police and fire departments. General instructions  Ask your health care provider for a referral to a local prenatal education class. Begin classes no later than the beginning of month 6 of your pregnancy.  Ask for help if you have counseling or nutritional needs during pregnancy. Your health care provider can offer advice or refer you to specialists for help  with various needs.  Do not use hot tubs, steam rooms, or saunas.  Do not douche or use tampons or scented sanitary pads.  Do not cross your legs for long periods of time.  Avoid cat litter boxes and soil used by cats. These carry germs that can cause birth defects in the baby and possibly loss of the fetus by miscarriage or stillbirth.  Avoid all smoking, herbs, alcohol, and medicines not prescribed by your health care provider. Chemicals in these products affect the formation and growth of the baby.  Do not use any products that contain nicotine or tobacco, such as cigarettes and e-cigarettes. If you need help quitting, ask your health care provider. You may receive counseling support and other resources to help you quit.  Schedule a dentist appointment. At home, brush your teeth with a soft toothbrush and be gentle when you floss. Contact a health care provider if:  You have dizziness.  You have mild pelvic cramps, pelvic pressure, or nagging pain in the abdominal area.  You have persistent nausea, vomiting, or diarrhea.  You have a bad smelling vaginal discharge.  You have pain when you urinate.  You notice increased swelling in your face, hands, legs, or ankles.  You are exposed to fifth disease or chickenpox.  You are exposed to German measles (rubella) and have never had it. Get help right away if:  You have a fever.  You are leaking fluid from your vagina.  You have spotting or bleeding from your vagina.  You have severe abdominal cramping or pain.  You have rapid weight gain or loss.  You vomit blood or material that looks like coffee grounds.  You develop a severe headache.  You have shortness of breath.  You have any kind of trauma, such as from a fall or a car accident. Summary  The first trimester of pregnancy is from week 1 until the end of week 13 (months 1 through 3).  Your body goes through many changes during pregnancy. The changes vary from  woman to woman.  You will have routine prenatal visits. During those visits, your health care provider will examine you, discuss any test results you may have, and talk with you about how you are feeling. This information is not intended to replace advice given to you by your health care provider. Make sure you discuss any questions you have with your health care provider. Document Released: 01/23/2001 Document Revised: 01/11/2016 Document Reviewed: 01/11/2016 Elsevier Interactive Patient Education  2017 Elsevier   Inc.  

## 2016-07-02 NOTE — Progress Notes (Signed)
New Obstetric Patient H&P   Chief Complaint: "Desires prenatal care"   History of Present Illness: Patient is a 41 y.o. G1P0000 Not Hispanic or Latino female, LMP 05/16/16 presents with amenorrhea and positive home pregnancy test. Based on her  LMP, her EDD is 02/20/17 and her EGA is 7726w5d. Cycles are regular monthly. Her last pap smear was 1 year ago (06/01/2015) and was ASCUS with NEGATIVE high risk HPV.    She had a urine pregnancy test which was positive 2 week(s)  ago. Her last menstrual period was slightly shorter and less heavy. Since her LMP she claims she has experienced fatigue, breast tenderness, and some mild nausea. She denies vaginal bleeding. Her past medical history is noncontributory. Her prior pregnancies are notable for none  Since her LMP, she admits to the use of tobacco products  no She claims she has gained   no pounds since the start of her pregnancy.  There are cats in the home in the home  yes If yes Indoor She admits close contact with children on a regular basis  no  She has had chicken pox in the past yes She has had Tuberculosis exposures, symptoms, or previously tested positive for TB   no Current or past history of domestic violence. no  Genetic Screening/Teratology Counseling: (Includes patient, baby's father, or anyone in either family with:)   1. [facility]  yes 2. Thalassemia ([place], [place], Mediterranean, or Asian background): MCV<80  no 3. Neural tube defect (meningomyelocele, spina bifida, anencephaly)  no 4. Congenital heart defect  no  5. Down syndrome  no 6. Tay-Sachs (Jewish, [place])  no 7. Canavan's Disease  no 8. Sickle cell disease or trait (African)  no  9. Hemophilia or other blood disorders  no  10. Muscular dystrophy  no  11. Cystic fibrosis  no  12. Huntington's Chorea  no  13. Mental retardation/autism  no 14. Other inherited genetic or chromosomal disorder  no 15. Maternal metabolic disorder (DM,  PKU, etc)  no 16. Patient or FOB with a child with a birth defect not listed above no  16a. Patient or FOB with a birth defect themselves no 17. Recurrent pregnancy loss, or stillbirth  no  18. Any medications since LMP other than prenatal vitamins (include vitamins, supplements, OTC meds, drugs, alcohol)  no 19. Any other genetic/environmental exposure to discuss  no  Infection History:   1. Lives with someone with TB or TB exposed  no  2. Patient or partner has history of genital herpes  no 3. Rash or viral illness since LMP  no 4. History of STI (GC, CT, HPV, syphilis, HIV)  no 5. History of recent travel :  no  Other pertinent information:  no     Review of Systems:10 point review of systems negative unless otherwise noted in HPI  Past Medical History:  Past Medical History:  Diagnosis Date  . Alcohol abuse   . Chicken pox   . Elevated blood pressure   . Generalized anxiety disorder   . Kidney stones     Past Surgical History:  Past Surgical History:  Procedure Laterality Date  . COLPOSCOPY  2005  . KNEE SURGERY Left   . WISDOM TOOTH EXTRACTION      Gynecologic History: Patient's last menstrual period was 05/16/2016.  Obstetric History: G1P0000  Family History:  Family History  Problem Relation Age of Onset  . Arthritis Father   .  Hyperlipidemia Father   . Hypertension Father   . Breast cancer Maternal Aunt     Social History:  Social History   Social History  . Marital status: Single    Spouse name: N/A  . Number of children: N/A  . Years of education: N/A   Occupational History  . Not on file.   Social History Main Topics  . Smoking status: Never Smoker  . Smokeless tobacco: Never Used  . Alcohol use No  . Drug use: No  . Sexual activity: Yes   Other Topics Concern  . 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   Enjoys exercising, running, playing soccer, art, gardening.      Allergies:  Allergies  Allergen Reactions  . Amoxicillin Hives  . Penicillins Hives    Medications: Prior to Admission medications   Medication Sig Start Date End Date Taking? Authorizing Provider  busPIRone (BUSPAR) 10 MG tablet Take 1 tablet (10 mg total) by mouth 2 (two) times daily. 04/25/16  Yes Doreene Nest, NP  escitalopram (LEXAPRO) 10 MG tablet TAKE 1 TABLET BY MOUTH ONCE DAILY 06/18/16  Yes Doreene Nest, NP  gabapentin (NEURONTIN) 100 MG capsule TAKE 2 CAPSULES BY MOUTH TWICE A DAY FOR NEUROPATHY 04/25/16  Yes Doreene Nest, NP  traZODone (DESYREL) 50 MG tablet TAKE 0.5-1 TABLETS (25-50 MG TOTAL) BY MOUTH AT BEDTIME AS NEEDED FOR SLEEP. 05/21/16  Yes Doreene Nest, NP  triamcinolone cream (KENALOG) 0.1 % Apply 1 application topically 2 (two) times daily. 05/16/15  Yes Doreene Nest, NP    Physical Exam Vitals: Last menstrual period 05/16/2016.  General: NAD HEENT: normocephalic, anicteric Thyroid: no enlargement, no palpable nodules Pulmonary: No increased work of breathing, CTAB Cardiovascular: RRR, distal pulses 2+ Abdomen: NABS, soft, non-tender, non-distended.  Umbilicus without lesions.  No hepatomegaly, splenomegaly or masses palpable. No evidence of hernia  Genitourinary:  External: Normal external female genitalia.  Normal urethral meatus, normal  Bartholin's and Skene's glands.    Vagina: Normal vaginal mucosa, no evidence of prolapse.    Cervix: Grossly normal in appearance, no bleeding  Uterus: Non-enlarged, mobile, normal contour.  No CMT  Adnexa: ovaries non-enlarged, no adnexal masses  Rectal: deferred Extremities: no edema, erythema, or tenderness Neurologic: Grossly intact Psychiatric: mood appropriate, affect full   Assessment: 41 y.o. G1P0000 at [redacted]w[redacted]d presenting to initiate prenatal care  Plan: 1) Avoid alcoholic beverages. 2) Patient encouraged not to smoke.  3) Discontinue the use of all non-medicinal drugs and  chemicals.  4) Take prenatal vitamins daily.  5) Nutrition, food safety (fish, cheese advisories, and high nitrite foods) and exercise discussed. 6) Hospital and practice style discussed with cross coverage system.  7) Genetic Screening, such as with 1st Trimester Screening, cell free fetal DNA, AFP testing, and Ultrasound, as well as with amniocentesis and CVS as appropriate, is discussed with patient. At the conclusion of today's visit patient requested genetic testing 8) Patient is asked about travel to areas at risk for the Zika virus, and counseled to avoid travel and exposure to mosquitoes or sexual partners who may have themselves been exposed to the virus. Testing is discussed, and will be ordered as appropriate.

## 2016-07-03 LAB — RPR+RH+ABO+RUB AB+AB SCR+CB...
Antibody Screen: NEGATIVE
HEMOGLOBIN: 12.9 g/dL (ref 11.1–15.9)
HIV Screen 4th Generation wRfx: NONREACTIVE
Hematocrit: 37.3 % (ref 34.0–46.6)
Hepatitis B Surface Ag: NEGATIVE
MCH: 30.2 pg (ref 26.6–33.0)
MCHC: 34.6 g/dL (ref 31.5–35.7)
MCV: 87 fL (ref 79–97)
PLATELETS: 283 10*3/uL (ref 150–379)
RBC: 4.27 x10E6/uL (ref 3.77–5.28)
RDW: 12.4 % (ref 12.3–15.4)
RPR Ser Ql: NONREACTIVE
Rh Factor: POSITIVE
Rubella Antibodies, IGG: 1.9 index (ref >0.99)
VARICELLA: 983 {index} (ref >165)
WBC: 7.2 10*3/uL (ref 3.4–10.8)

## 2016-07-04 LAB — GC/CHLAMYDIA PROBE AMP
Chlamydia trachomatis, NAA: NEGATIVE
Neisseria gonorrhoeae by PCR: NEGATIVE

## 2016-07-04 LAB — URINE CULTURE

## 2016-07-07 ENCOUNTER — Encounter: Payer: Self-pay | Admitting: Obstetrics and Gynecology

## 2016-07-11 ENCOUNTER — Ambulatory Visit (INDEPENDENT_AMBULATORY_CARE_PROVIDER_SITE_OTHER): Payer: BLUE CROSS/BLUE SHIELD | Admitting: Advanced Practice Midwife

## 2016-07-11 ENCOUNTER — Ambulatory Visit (INDEPENDENT_AMBULATORY_CARE_PROVIDER_SITE_OTHER): Payer: BLUE CROSS/BLUE SHIELD

## 2016-07-11 VITALS — BP 120/56 | Wt 162.0 lb

## 2016-07-11 DIAGNOSIS — Z3A01 Less than 8 weeks gestation of pregnancy: Secondary | ICD-10-CM

## 2016-07-11 DIAGNOSIS — Z362 Encounter for other antenatal screening follow-up: Secondary | ICD-10-CM | POA: Diagnosis not present

## 2016-07-11 DIAGNOSIS — O09529 Supervision of elderly multigravida, unspecified trimester: Secondary | ICD-10-CM

## 2016-07-11 DIAGNOSIS — Z3A12 12 weeks gestation of pregnancy: Secondary | ICD-10-CM

## 2016-07-11 DIAGNOSIS — O0990 Supervision of high risk pregnancy, unspecified, unspecified trimester: Secondary | ICD-10-CM

## 2016-07-11 DIAGNOSIS — Z1379 Encounter for other screening for genetic and chromosomal anomalies: Secondary | ICD-10-CM

## 2016-07-11 HISTORY — DX: Supervision of high risk pregnancy, unspecified, unspecified trimester: O09.90

## 2016-07-11 NOTE — Progress Notes (Signed)
Dating scan today baby is 5134w5d. Due date is adjusted to 01/18/2017. Nausea is subsiding and patient is generally feeling well. Informaseq today.

## 2016-07-11 NOTE — Progress Notes (Signed)
Dating scan today.  

## 2016-07-17 LAB — INFORMASEQ(SM) WITH XY ANALYSIS
Fetal Fraction (%):: 8.1
Fetal Number: 1
Gestational Age at Collection: 12.4 weeks
WEIGHT: 162 [lb_av]

## 2016-07-18 ENCOUNTER — Encounter: Payer: Self-pay | Admitting: Obstetrics and Gynecology

## 2016-07-19 ENCOUNTER — Encounter: Payer: Self-pay | Admitting: Obstetrics and Gynecology

## 2016-08-08 ENCOUNTER — Ambulatory Visit (INDEPENDENT_AMBULATORY_CARE_PROVIDER_SITE_OTHER): Payer: BLUE CROSS/BLUE SHIELD | Admitting: Obstetrics and Gynecology

## 2016-08-08 VITALS — BP 122/62 | Wt 165.0 lb

## 2016-08-08 DIAGNOSIS — O0990 Supervision of high risk pregnancy, unspecified, unspecified trimester: Secondary | ICD-10-CM

## 2016-08-08 DIAGNOSIS — O09519 Supervision of elderly primigravida, unspecified trimester: Secondary | ICD-10-CM

## 2016-08-08 DIAGNOSIS — Z363 Encounter for antenatal screening for malformations: Secondary | ICD-10-CM

## 2016-08-08 DIAGNOSIS — Z3A16 16 weeks gestation of pregnancy: Secondary | ICD-10-CM

## 2016-08-08 NOTE — Progress Notes (Signed)
ROB

## 2016-08-22 ENCOUNTER — Ambulatory Visit (INDEPENDENT_AMBULATORY_CARE_PROVIDER_SITE_OTHER): Payer: BLUE CROSS/BLUE SHIELD | Admitting: Advanced Practice Midwife

## 2016-08-22 ENCOUNTER — Ambulatory Visit (INDEPENDENT_AMBULATORY_CARE_PROVIDER_SITE_OTHER): Payer: BLUE CROSS/BLUE SHIELD

## 2016-08-22 VITALS — BP 118/70 | Wt 167.0 lb

## 2016-08-22 DIAGNOSIS — Z363 Encounter for antenatal screening for malformations: Secondary | ICD-10-CM

## 2016-08-22 DIAGNOSIS — Z3A18 18 weeks gestation of pregnancy: Secondary | ICD-10-CM

## 2016-08-22 NOTE — Progress Notes (Signed)
Anatomy scan complete today. Feeling well- some fatigue. Good fetal movement. Going to the beach next week and reviewed restrictions- avoid rough surf, stay hydrated, etc. Information given on childbirth education classes.

## 2016-08-22 NOTE — Progress Notes (Signed)
No vb. No lof.  

## 2016-09-19 ENCOUNTER — Encounter: Payer: BLUE CROSS/BLUE SHIELD | Admitting: Obstetrics and Gynecology

## 2016-09-26 ENCOUNTER — Ambulatory Visit (INDEPENDENT_AMBULATORY_CARE_PROVIDER_SITE_OTHER): Payer: BLUE CROSS/BLUE SHIELD | Admitting: Advanced Practice Midwife

## 2016-09-26 VITALS — BP 104/62 | Wt 181.0 lb

## 2016-09-26 DIAGNOSIS — Z131 Encounter for screening for diabetes mellitus: Secondary | ICD-10-CM

## 2016-09-26 DIAGNOSIS — Z113 Encounter for screening for infections with a predominantly sexual mode of transmission: Secondary | ICD-10-CM

## 2016-09-26 DIAGNOSIS — Z130 Encounter for screening for diseases of the blood and blood-forming organs and certain disorders involving the immune mechanism: Secondary | ICD-10-CM

## 2016-09-26 DIAGNOSIS — Z3A23 23 weeks gestation of pregnancy: Secondary | ICD-10-CM

## 2016-09-26 NOTE — Progress Notes (Signed)
Patient has questions about weight gain. She says she drinks a lot every day and wonders if that could explain her recent weight gain. She has no edema. She is encouraged to eat healthy foods and stay active. She wonders about the effects of her medications on the baby. Reassurance given that her mental health is important and with some medications risks/benefits are weighed. 28 week labs nv.

## 2016-09-26 NOTE — Progress Notes (Signed)
Pt reports no problems.   

## 2016-10-10 ENCOUNTER — Encounter: Payer: Self-pay | Admitting: Obstetrics and Gynecology

## 2016-10-12 ENCOUNTER — Encounter: Payer: Self-pay | Admitting: Obstetrics and Gynecology

## 2016-10-15 ENCOUNTER — Other Ambulatory Visit: Payer: Self-pay | Admitting: Primary Care

## 2016-10-15 DIAGNOSIS — F411 Generalized anxiety disorder: Secondary | ICD-10-CM

## 2016-10-18 ENCOUNTER — Encounter: Payer: Self-pay | Admitting: Obstetrics and Gynecology

## 2016-10-24 ENCOUNTER — Other Ambulatory Visit: Payer: BLUE CROSS/BLUE SHIELD

## 2016-10-24 ENCOUNTER — Ambulatory Visit (INDEPENDENT_AMBULATORY_CARE_PROVIDER_SITE_OTHER): Payer: BLUE CROSS/BLUE SHIELD | Admitting: Advanced Practice Midwife

## 2016-10-24 VITALS — BP 120/80 | Wt 188.0 lb

## 2016-10-24 DIAGNOSIS — Z131 Encounter for screening for diabetes mellitus: Secondary | ICD-10-CM

## 2016-10-24 DIAGNOSIS — Z3A27 27 weeks gestation of pregnancy: Secondary | ICD-10-CM

## 2016-10-24 DIAGNOSIS — Z113 Encounter for screening for infections with a predominantly sexual mode of transmission: Secondary | ICD-10-CM

## 2016-10-24 DIAGNOSIS — Z130 Encounter for screening for diseases of the blood and blood-forming organs and certain disorders involving the immune mechanism: Secondary | ICD-10-CM

## 2016-10-24 NOTE — Patient Instructions (Signed)
Third Trimester of Pregnancy The third trimester is from week 28 through week 40 (months 7 through 9). The third trimester is a time when the unborn baby (fetus) is growing rapidly. At the end of the ninth month, the fetus is about 20 inches in length and weighs 6-10 pounds. Body changes during your third trimester Your body will continue to go through many changes during pregnancy. The changes vary from woman to woman. During the third trimester:  Your weight will continue to increase. You can expect to gain 25-35 pounds (11-16 kg) by the end of the pregnancy.  You may begin to get stretch marks on your hips, abdomen, and breasts.  You may urinate more often because the fetus is moving lower into your pelvis and pressing on your bladder.  You may develop or continue to have heartburn. This is caused by increased hormones that slow down muscles in the digestive tract.  You may develop or continue to have constipation because increased hormones slow digestion and cause the muscles that push waste through your intestines to relax.  You may develop hemorrhoids. These are swollen veins (varicose veins) in the rectum that can itch or be painful.  You may develop swollen, bulging veins (varicose veins) in your legs.  You may have increased body aches in the pelvis, back, or thighs. This is due to weight gain and increased hormones that are relaxing your joints.  You may have changes in your hair. These can include thickening of your hair, rapid growth, and changes in texture. Some women also have hair loss during or after pregnancy, or hair that feels dry or thin. Your hair will most likely return to normal after your baby is born.  Your breasts will continue to grow and they will continue to become tender. A yellow fluid (colostrum) may leak from your breasts. This is the first milk you are producing for your baby.  Your belly button may stick out.  You may notice more swelling in your hands,  face, or ankles.  You may have increased tingling or numbness in your hands, arms, and legs. The skin on your belly may also feel numb.  You may feel short of breath because of your expanding uterus.  You may have more problems sleeping. This can be caused by the size of your belly, increased need to urinate, and an increase in your body's metabolism.  You may notice the fetus "dropping," or moving lower in your abdomen (lightening).  You may have increased vaginal discharge.  You may notice your joints feel loose and you may have pain around your pelvic bone.  What to expect at prenatal visits You will have prenatal exams every 2 weeks until week 36. Then you will have weekly prenatal exams. During a routine prenatal visit:  You will be weighed to make sure you and the baby are growing normally.  Your blood pressure will be taken.  Your abdomen will be measured to track your baby's growth.  The fetal heartbeat will be listened to.  Any test results from the previous visit will be discussed.  You may have a cervical check near your due date to see if your cervix has softened or thinned (effaced).  You will be tested for Group B streptococcus. This happens between 35 and 37 weeks.  Your health care provider may ask you:  What your birth plan is.  How you are feeling.  If you are feeling the baby move.  If you have had   any abnormal symptoms, such as leaking fluid, bleeding, severe headaches, or abdominal cramping.  If you are using any tobacco products, including cigarettes, chewing tobacco, and electronic cigarettes.  If you have any questions.  Other tests or screenings that may be performed during your third trimester include:  Blood tests that check for low iron levels (anemia).  Fetal testing to check the health, activity level, and growth of the fetus. Testing is done if you have certain medical conditions or if there are problems during the  pregnancy.  Nonstress test (NST). This test checks the health of your baby to make sure there are no signs of problems, such as the baby not getting enough oxygen. During this test, a belt is placed around your belly. The baby is made to move, and its heart rate is monitored during movement.  What is false labor? False labor is a condition in which you feel small, irregular tightenings of the muscles in the womb (contractions) that usually go away with rest, changing position, or drinking water. These are called Braxton Hicks contractions. Contractions may last for hours, days, or even weeks before true labor sets in. If contractions come at regular intervals, become more frequent, increase in intensity, or become painful, you should see your health care provider. What are the signs of labor?  Abdominal cramps.  Regular contractions that start at 10 minutes apart and become stronger and more frequent with time.  Contractions that start on the top of the uterus and spread down to the lower abdomen and back.  Increased pelvic pressure and dull back pain.  A watery or bloody mucus discharge that comes from the vagina.  Leaking of amniotic fluid. This is also known as your "water breaking." It could be a slow trickle or a gush. Let your health care provider know if it has a color or strange odor. If you have any of these signs, call your health care provider right away, even if it is before your due date. Follow these instructions at home: Medicines  Follow your health care provider's instructions regarding medicine use. Specific medicines may be either safe or unsafe to take during pregnancy.  Take a prenatal vitamin that contains at least 600 micrograms (mcg) of folic acid.  If you develop constipation, try taking a stool softener if your health care provider approves. Eating and drinking  Eat a balanced diet that includes fresh fruits and vegetables, whole grains, good sources of protein  such as meat, eggs, or tofu, and low-fat dairy. Your health care provider will help you determine the amount of weight gain that is right for you.  Avoid raw meat and uncooked cheese. These carry germs that can cause birth defects in the baby.  If you have low calcium intake from food, talk to your health care provider about whether you should take a daily calcium supplement.  Eat four or five small meals rather than three large meals a day.  Limit foods that are high in fat and processed sugars, such as fried and sweet foods.  To prevent constipation: ? Drink enough fluid to keep your urine clear or pale yellow. ? Eat foods that are high in fiber, such as fresh fruits and vegetables, whole grains, and beans. Activity  Exercise only as directed by your health care provider. Most women can continue their usual exercise routine during pregnancy. Try to exercise for 30 minutes at least 5 days a week. Stop exercising if you experience uterine contractions.  Avoid heavy   lifting.  Do not exercise in extreme heat or humidity, or at high altitudes.  Wear low-heel, comfortable shoes.  Practice good posture.  You may continue to have sex unless your health care provider tells you otherwise. Relieving pain and discomfort  Take frequent breaks and rest with your legs elevated if you have leg cramps or low back pain.  Take warm sitz baths to soothe any pain or discomfort caused by hemorrhoids. Use hemorrhoid cream if your health care provider approves.  Wear a good support bra to prevent discomfort from breast tenderness.  If you develop varicose veins: ? Wear support pantyhose or compression stockings as told by your healthcare provider. ? Elevate your feet for 15 minutes, 3-4 times a day. Prenatal care  Write down your questions. Take them to your prenatal visits.  Keep all your prenatal visits as told by your health care provider. This is important. Safety  Wear your seat belt at  all times when driving.  Make a list of emergency phone numbers, including numbers for family, friends, the hospital, and police and fire departments. General instructions  Avoid cat litter boxes and soil used by cats. These carry germs that can cause birth defects in the baby. If you have a cat, ask someone to clean the litter box for you.  Do not travel far distances unless it is absolutely necessary and only with the approval of your health care provider.  Do not use hot tubs, steam rooms, or saunas.  Do not drink alcohol.  Do not use any products that contain nicotine or tobacco, such as cigarettes and e-cigarettes. If you need help quitting, ask your health care provider.  Do not use any medicinal herbs or unprescribed drugs. These chemicals affect the formation and growth of the baby.  Do not douche or use tampons or scented sanitary pads.  Do not cross your legs for long periods of time.  To prepare for the arrival of your baby: ? Take prenatal classes to understand, practice, and ask questions about labor and delivery. ? Make a trial run to the hospital. ? Visit the hospital and tour the maternity area. ? Arrange for maternity or paternity leave through employers. ? Arrange for family and friends to take care of pets while you are in the hospital. ? Purchase a rear-facing car seat and make sure you know how to install it in your car. ? Pack your hospital bag. ? Prepare the baby's nursery. Make sure to remove all pillows and stuffed animals from the baby's crib to prevent suffocation.  Visit your dentist if you have not gone during your pregnancy. Use a soft toothbrush to brush your teeth and be gentle when you floss. Contact a health care provider if:  You are unsure if you are in labor or if your water has broken.  You become dizzy.  You have mild pelvic cramps, pelvic pressure, or nagging pain in your abdominal area.  You have lower back pain.  You have persistent  nausea, vomiting, or diarrhea.  You have an unusual or bad smelling vaginal discharge.  You have pain when you urinate. Get help right away if:  Your water breaks before 37 weeks.  You have regular contractions less than 5 minutes apart before 37 weeks.  You have a fever.  You are leaking fluid from your vagina.  You have spotting or bleeding from your vagina.  You have severe abdominal pain or cramping.  You have rapid weight loss or weight gain.    You have shortness of breath with chest pain.  You notice sudden or extreme swelling of your face, hands, ankles, feet, or legs.  Your baby makes fewer than 10 movements in 2 hours.  You have severe headaches that do not go away when you take medicine.  You have vision changes. Summary  The third trimester is from week 28 through week 40, months 7 through 9. The third trimester is a time when the unborn baby (fetus) is growing rapidly.  During the third trimester, your discomfort may increase as you and your baby continue to gain weight. You may have abdominal, leg, and back pain, sleeping problems, and an increased need to urinate.  During the third trimester your breasts will keep growing and they will continue to become tender. A yellow fluid (colostrum) may leak from your breasts. This is the first milk you are producing for your baby.  False labor is a condition in which you feel small, irregular tightenings of the muscles in the womb (contractions) that eventually go away. These are called Braxton Hicks contractions. Contractions may last for hours, days, or even weeks before true labor sets in.  Signs of labor can include: abdominal cramps; regular contractions that start at 10 minutes apart and become stronger and more frequent with time; watery or bloody mucus discharge that comes from the vagina; increased pelvic pressure and dull back pain; and leaking of amniotic fluid. This information is not intended to replace advice  given to you by your health care provider. Make sure you discuss any questions you have with your health care provider. Document Released: 01/23/2001 Document Revised: 07/07/2015 Document Reviewed: 04/01/2012 Elsevier Interactive Patient Education  2017 [employer]  

## 2016-10-24 NOTE — Progress Notes (Signed)
Good fetal movement. Denies Ctx's, LOF, VB. Discussion of normal physiologic changes of third trimester and information given. 28 week labs done today. Patient continues to stay active and hydrated.

## 2016-10-25 LAB — 28 WEEK RH+PANEL
BASOS ABS: 0 10*3/uL (ref 0.0–0.2)
BASOS: 0 %
EOS (ABSOLUTE): 0.3 10*3/uL (ref 0.0–0.4)
Eos: 3 %
GESTATIONAL DIABETES SCREEN: 77 mg/dL (ref 65–139)
HIV SCREEN 4TH GENERATION: NONREACTIVE
Hematocrit: 31.7 % — ABNORMAL LOW (ref 34.0–46.6)
Hemoglobin: 11.1 g/dL (ref 11.1–15.9)
IMMATURE GRANS (ABS): 0 10*3/uL (ref 0.0–0.1)
IMMATURE GRANULOCYTES: 0 %
Lymphocytes Absolute: 1.2 10*3/uL (ref 0.7–3.1)
Lymphs: 13 %
MCH: 31.2 pg (ref 26.6–33.0)
MCHC: 35 g/dL (ref 31.5–35.7)
MCV: 89 fL (ref 79–97)
Monocytes Absolute: 0.7 10*3/uL (ref 0.1–0.9)
Monocytes: 7 %
NEUTROS PCT: 77 %
Neutrophils Absolute: 7 10*3/uL (ref 1.4–7.0)
Platelets: 263 10*3/uL (ref 150–379)
RBC: 3.56 x10E6/uL — AB (ref 3.77–5.28)
RDW: 13.8 % (ref 12.3–15.4)
RPR: NONREACTIVE
WBC: 9.2 10*3/uL (ref 3.4–10.8)

## 2016-11-06 ENCOUNTER — Ambulatory Visit (INDEPENDENT_AMBULATORY_CARE_PROVIDER_SITE_OTHER): Payer: BLUE CROSS/BLUE SHIELD | Admitting: Obstetrics & Gynecology

## 2016-11-06 VITALS — BP 110/70 | Wt 194.0 lb

## 2016-11-06 DIAGNOSIS — O09513 Supervision of elderly primigravida, third trimester: Secondary | ICD-10-CM

## 2016-11-06 DIAGNOSIS — Z3A29 29 weeks gestation of pregnancy: Secondary | ICD-10-CM

## 2016-11-06 DIAGNOSIS — O0990 Supervision of high risk pregnancy, unspecified, unspecified trimester: Secondary | ICD-10-CM

## 2016-11-06 MED ORDER — SERTRALINE HCL 100 MG PO TABS: 100.0000 mg | ORAL_TABLET | Freq: Every day | ORAL | 2 refills | Status: DC

## 2016-11-06 NOTE — Patient Instructions (Addendum)

## 2016-11-06 NOTE — Progress Notes (Signed)
  Subjective  Fetal Movement? yes Contractions? no Leaking Fluid? no Vaginal Bleeding? no  Objective  WEIGHTCHANGE- 30 lb weight gain BP 110/70   Wt 194 lb (88 kg)   LMP 05/16/2016   BMI 29.50 kg/m  General: NAD Pumonary: no increased work of breathing Abdomen: gravid, non-tender Extremities: no edema Psychiatric: mood appropriate, affect full  [facility] Prenatal Labs  Dating [place] Blood type: O/Positive/-- (05/21 1523)   Genetic Screen NIPS: Normal XX Antibody:Negative (05/21 1523)  Anatomic [place] WS Rubella: 1.90 (05/21 1523)  GTT          Third trimester: 77  RPR: Non Reactive (09/12 1553)   Flu vaccine  HBsAg: Negative (05/21 1523)   TDaP vaccine                          Rhogam: HIV:     Baby Food Breast                                              GBS: (For PCN allergy, check sensitivities)  Contraception  Pap:  CS/VBAC    Cord Blood    Support Person        Assessment   41 y.o. G1P0000 at [redacted]w[redacted]d by  01/19/2017, by Ultrasound presenting for routine prenatal visit  Plan   Problem List Items Addressed This Visit      Other   Advanced maternal age, 1st pregnancy, unspecified trimester   Supervision of high risk pregnancy, antepartum - Primary    Other Visit Diagnoses    [redacted] weeks gestation of pregnancy        Breast feeding plans Anxiety discussed.  Change from Lexapro to Zoloft due to slight difference in breast feeding scores.  Buspar PRN. Annamarie Major, MD, [facility], [facility] 11/06/2016  11:54 AM

## 2016-11-21 ENCOUNTER — Ambulatory Visit (INDEPENDENT_AMBULATORY_CARE_PROVIDER_SITE_OTHER): Payer: BLUE CROSS/BLUE SHIELD | Admitting: Obstetrics and Gynecology

## 2016-11-21 VITALS — BP 112/68 | Wt 200.0 lb

## 2016-11-21 DIAGNOSIS — Z23 Encounter for immunization: Secondary | ICD-10-CM | POA: Diagnosis not present

## 2016-11-21 DIAGNOSIS — O09519 Supervision of elderly primigravida, unspecified trimester: Secondary | ICD-10-CM

## 2016-11-21 DIAGNOSIS — O0990 Supervision of high risk pregnancy, unspecified, unspecified trimester: Secondary | ICD-10-CM

## 2016-11-21 DIAGNOSIS — Z3A31 31 weeks gestation of pregnancy: Secondary | ICD-10-CM

## 2016-11-21 NOTE — Progress Notes (Signed)
ROB TDAP not in stock, will get NV

## 2016-12-04 ENCOUNTER — Encounter: Payer: Self-pay | Admitting: Obstetrics and Gynecology

## 2016-12-05 ENCOUNTER — Ambulatory Visit (INDEPENDENT_AMBULATORY_CARE_PROVIDER_SITE_OTHER): Payer: BLUE CROSS/BLUE SHIELD | Admitting: Obstetrics and Gynecology

## 2016-12-05 VITALS — BP 118/76 | Wt 203.0 lb

## 2016-12-05 DIAGNOSIS — O09519 Supervision of elderly primigravida, unspecified trimester: Secondary | ICD-10-CM

## 2016-12-05 DIAGNOSIS — Z3A33 33 weeks gestation of pregnancy: Secondary | ICD-10-CM

## 2016-12-05 DIAGNOSIS — O0990 Supervision of high risk pregnancy, unspecified, unspecified trimester: Secondary | ICD-10-CM

## 2016-12-05 NOTE — Progress Notes (Signed)
Routine Prenatal Care Visit  Subjective  Cynthia Diaz is a 41 y.o. G1P0000 at [redacted]w[redacted]d being seen today for ongoing prenatal care.  She is currently monitored for the following issues for this high-risk pregnancy and has Insomnia; Generalized anxiety disorder; GERD (gastroesophageal reflux disease); Preventative health care; Eczema of external ear; Advanced maternal age, 1st pregnancy, unspecified trimester; and Supervision of high risk pregnancy, antepartum on her problem list.  ----------------------------------------------------------------------------------- Patient reports no complaints.   Contractions: Not present. Vag. Bleeding: None.  Movement: Present. Denies leaking of fluid.  ----------------------------------------------------------------------------------- The following portions of the patient's history were reviewed and updated as appropriate: allergies, current medications, past family history, past medical history, past social history, past surgical history and problem list. Problem list updated.   Objective  Blood pressure 118/76, weight 203 lb (92.1 kg), last menstrual period 05/16/2016. Pregravid weight 162 lb (73.5 kg) Total Weight Gain 41 lb (18.6 kg) Urinalysis: Urine Protein: Negative Urine Glucose: Negative  Fetal Status: Fetal Heart Rate (bpm): 140 Fundal Height: 34 cm Movement: Present     General:  Alert, oriented and cooperative. Patient is in no acute distress.  Skin: Skin is warm and dry. No rash noted.   Cardiovascular: Normal heart rate noted  Respiratory: Normal respiratory effort, no problems with respiration noted  Abdomen: Soft, gravid, appropriate for gestational age. Pain/Pressure: Present     Pelvic:  Cervical exam deferred        Extremities: Normal range of motion.     Mental Status: Normal mood and affect. Normal behavior. Normal judgment and thought content.   Assessment   42 y.o. G1P0000 at [redacted]w[redacted]d by  01/19/2017, by Ultrasound presenting for  routine prenatal visit  Plan   Pregnancy #1 Problems (from 05/12/16 to present)    Problem Noted Resolved   Advanced maternal age, 1st pregnancy, unspecified trimester 07/02/2016 by Vena Austria, MD No   Overview Addendum 12/05/2016  4:07 PM by Conard Novak, MD    Clinic [facility] Prenatal Labs  Dating  Blood type:     Genetic Screen 1 Screen:    AFP:     Quad:     NIPS: Antibody:   Anatomic [place]  Rubella:   Varicella: I  GTT Early:               Third trimester:  RPR:     Rhogam  HBsAg:     TDaP vaccine                       Flu Shot: HIV:     Baby Food                                GBS:   Contraception  Pap: 06/01/15 ASCUS HPV negative  CBB     CS/VBAC    Support Person         [ ]  Needs growth u/s 32-34 weeks [ ]  AP testing 36 weeks w NST [ ]  deliver by 40 weeks       Generalized anxiety disorder 04/27/2015 by Doreene Nest, NP No      Preterm labor symptoms and general obstetric precautions including but not limited to vaginal bleeding, contractions, leaking of fluid and fetal movement were reviewed in detail with the patient. Please refer to After Visit Summary for other counseling recommendations.   Return in about 1 week (around 12/12/2016) for schedule u/s for  growth and routine prenatal.  Thomasene MohairStephen Ayce Pietrzyk, MD  12/05/2016 4:25 PM

## 2016-12-10 ENCOUNTER — Other Ambulatory Visit: Payer: Self-pay | Admitting: Primary Care

## 2016-12-10 DIAGNOSIS — G4700 Insomnia, unspecified: Secondary | ICD-10-CM

## 2016-12-11 NOTE — Addendum Note (Signed)
Addended by: Tawnya CrookSAMBATH, Jaela Yepez on: 12/11/2016 09:07 AM   Modules accepted: Orders

## 2016-12-13 ENCOUNTER — Ambulatory Visit (INDEPENDENT_AMBULATORY_CARE_PROVIDER_SITE_OTHER): Payer: BLUE CROSS/BLUE SHIELD

## 2016-12-13 ENCOUNTER — Ambulatory Visit (INDEPENDENT_AMBULATORY_CARE_PROVIDER_SITE_OTHER): Payer: BLUE CROSS/BLUE SHIELD | Admitting: Maternal Newborn

## 2016-12-13 VITALS — BP 120/80 | HR 84 | Wt 207.0 lb

## 2016-12-13 DIAGNOSIS — O09519 Supervision of elderly primigravida, unspecified trimester: Secondary | ICD-10-CM

## 2016-12-13 DIAGNOSIS — Z362 Encounter for other antenatal screening follow-up: Secondary | ICD-10-CM | POA: Diagnosis not present

## 2016-12-13 DIAGNOSIS — O0990 Supervision of high risk pregnancy, unspecified, unspecified trimester: Secondary | ICD-10-CM

## 2016-12-13 DIAGNOSIS — Z3A34 34 weeks gestation of pregnancy: Secondary | ICD-10-CM

## 2016-12-13 NOTE — Addendum Note (Signed)
Addended by: Liliane ShiSHAW, BEVERLY M on: 12/13/2016 10:13 AM   Modules accepted: Orders

## 2016-12-13 NOTE — Progress Notes (Signed)
Routine Prenatal Care Visit  Subjective  Cynthia Diaz is a 41 y.o. G1P0000 at [redacted]w[redacted]d being seen today for ongoing prenatal care.  She is currently monitored for the following issues for this high-risk pregnancy and has Insomnia; Generalized anxiety disorder; GERD (gastroesophageal reflux disease); Preventative health care; Eczema of external ear; Advanced maternal age, 1st pregnancy, unspecified trimester; and Supervision of high risk pregnancy, antepartum on her problem list.  ----------------------------------------------------------------------------------- Patient reports heart palpitations and joint pain/popping in neck..   Contractions: Not present. Vag. Bleeding: None.  Movement: Present. Denies leaking of fluid.  ----------------------------------------------------------------------------------- The following portions of the patient's history were reviewed and updated as appropriate: allergies, current medications, past family history, past medical history, past social history, past surgical history and problem list. Problem list updated.   Objective  Last menstrual period 05/16/2016. Pregravid weight 162 lb (73.5 kg) Total Weight Gain 45 lb (20.4 kg) Urinalysis: Urine Protein: Negative Urine Glucose: Negative  Fetal Status: Fetal Heart Rate (bpm): 126   Movement: Present     General:  Alert, oriented and cooperative. Patient is in no acute distress.  Skin: Skin is warm and dry. No rash noted.   Cardiovascular: Normal heart rate noted  Respiratory: Normal respiratory effort, no problems with respiration noted  Abdomen: Soft, gravid, appropriate for gestational age. Pain/Pressure: Absent     Pelvic:  Cervical exam deferred        Extremities: Normal range of motion.  Edema: Trace  Mental Status: Normal mood and affect. Normal behavior. Normal judgment and thought content.     Assessment   42 y.o. G1P0000 at [redacted]w[redacted]d by  01/19/2017, by Ultrasound presenting for routine  prenatal visit.  Plan   Pregnancy #1 Problems (from 05/12/16 to present)    Problem Noted Resolved   Advanced maternal age, 1st pregnancy, unspecified trimester 07/02/2016 by Vena Austria, MD No   Overview Addendum 12/05/2016  4:07 PM by Conard Novak, MD    [facility]inic [facility] Prenatal Labs  Dating  Blood type:     Genetic Screen 1 Screen:    AFP:     Quad:     NIPS: Antibody:   Anatomic [place]  Rubella:   Varicella: I  GTT Early:               Third trimester:  RPR:     Rhogam  HBsAg:     TDaP vaccine                       Flu Shot: HIV:     Baby Food                                GBS:   Contraception  Pap: 06/01/15 ASCUS HPV negative  CBB     CS/VBAC    Support Person         [ ]  Needs growth u/s 32-34 weeks [ ]  AP testing 36 weeks w NST [ ]  deliver by 40 weeks       Generalized anxiety disorder 04/27/2015 by Doreene Nest, NP No     Growth ultrasound today, reviewed and normal:    Preterm labor symptoms and general obstetric precautions including but not limited to vaginal bleeding, contractions, leaking of fluid and fetal movement were reviewed in detail with the patient.  Discussed that heart palpitations can be normal in pregnancy but to report any chest pain,  difficulty breathing, or syncope because there would be a low threshold to perform an EKG or Holter monitoring to rule out pathology.  She has an ongoing problem with a joint in her neck; reporting pain and popping sounds. Tylenol and heat are not helping much with pain. Advised alternating with ice and trying massage therapy.  Keep appointment next week; GBS/GC and NST nv.  Return in about 2 weeks (around 12/27/2016) for ROB with NST.  Marcelyn BruinsJacelyn Shamara Soza, CNM 12/13/2016  4:21 PM

## 2016-12-13 NOTE — Progress Notes (Signed)
C/o heart palpitations.

## 2016-12-21 ENCOUNTER — Ambulatory Visit (INDEPENDENT_AMBULATORY_CARE_PROVIDER_SITE_OTHER): Payer: BLUE CROSS/BLUE SHIELD | Admitting: Obstetrics and Gynecology

## 2016-12-21 ENCOUNTER — Encounter: Payer: Self-pay | Admitting: Obstetrics and Gynecology

## 2016-12-21 VITALS — BP 118/74 | Wt 207.0 lb

## 2016-12-21 DIAGNOSIS — Z3A35 35 weeks gestation of pregnancy: Secondary | ICD-10-CM

## 2016-12-21 DIAGNOSIS — O0990 Supervision of high risk pregnancy, unspecified, unspecified trimester: Secondary | ICD-10-CM

## 2016-12-21 DIAGNOSIS — F411 Generalized anxiety disorder: Secondary | ICD-10-CM

## 2016-12-21 DIAGNOSIS — O09519 Supervision of elderly primigravida, unspecified trimester: Secondary | ICD-10-CM

## 2016-12-21 NOTE — Progress Notes (Signed)
Routine Prenatal Care Visit  Subjective  Cynthia Diaz is a 41 y.o. G1P0000 at 5660w6d being seen today for ongoing prenatal care.  She is currently monitored for the following issues for this high-risk pregnancy and has Insomnia; Generalized anxiety disorder; GERD (gastroesophageal reflux disease); Preventative health care; Eczema of external ear; Advanced maternal age, 1st pregnancy, unspecified trimester; and Supervision of high risk pregnancy, antepartum on their problem list.  ----------------------------------------------------------------------------------- Patient reports no complaints.   Contractions: Not present. Vag. Bleeding: None.  Movement: Present. Denies leaking of fluid.  ----------------------------------------------------------------------------------- The following portions of the patient's history were reviewed and updated as appropriate: allergies, current medications, past family history, past medical history, past social history, past surgical history and problem list. Problem list updated.   Objective  Blood pressure 118/74, weight 207 lb (93.9 kg), last menstrual period 05/16/2016. Pregravid weight 162 lb (73.5 kg) Total Weight Gain 45 lb (20.4 kg) Urinalysis: Urine Protein: Negative Urine Glucose: Negative  Fetal Status: Fetal Heart Rate (bpm): 140   Movement: Present     General:  Alert, oriented and cooperative. Patient is in no acute distress.  Skin: Skin is warm and dry. No rash noted.   Cardiovascular: Normal heart rate noted  Respiratory: Normal respiratory effort, no problems with respiration noted  Abdomen: Soft, gravid, appropriate for gestational age. Pain/Pressure: Absent     Pelvic:  Cervical exam deferred        Extremities: Normal range of motion.     Mental Status: Normal mood and affect. Normal behavior. Normal judgment and thought content.   NST: Baseline FHR: 140 beats/min Variability: moderate Accelerations: present Decelerations:  absent Tocometry: not done  Interpretation:  INDICATIONS: advanced maternal age (age > 5340) RESULTS:  A NST procedure was performed with FHR monitoring and a normal baseline established, appropriate time of 20-40 minutes of evaluation, and accels >2 seen w 15x15 characteristics.  Results show a REACTIVE NST.   Assessment   41 y.o. G1P0000 at 8160w6d by  01/19/2017, by Ultrasound presenting for routine prenatal visit  Plan   Pregnancy #1 Problems (from 05/12/16 to present)    Problem Noted Resolved   Advanced maternal age, 1st pregnancy, unspecified trimester 07/02/2016 by Vena AustriaStaebler, Andreas, MD No   Overview Addendum 12/21/2016  3:43 PM by Conard NovakJackson, Kaio Kuhlman D, MD    [facility] Prenatal Labs  Dating  Blood type:     Genetic Screen 1 Screen:    AFP:     Quad:     NIPS: Antibody:   Anatomic US  Rubella:   Varicella: I  GTT Early:               Third trimester:  RPR:     Rhogam  HBsAg:     TDaP vaccine                       Flu Shot: HIV:     Baby Food                                GBS:   Contraception  Pap: 06/01/15 ASCUS HPV negative  CBB     CS/VBAC    Support Person         [x]  Needs growth u/s 32-34 weeks (normal) [ ]  AP testing 36 weeks w NST [ ]  deliver by 40 weeks      Generalized anxiety disorder 04/27/2015 by Doreene Nestlark, Katherine K,  NP No     Preterm labor symptoms and general obstetric precautions including but not limited to vaginal bleeding, contractions, leaking of fluid and fetal movement were reviewed in detail with the patient. Please refer to After Visit Summary for other counseling recommendations.   Return in about 1 week (around 12/28/2016) for schedule routine prenatal with NST.  Thomasene MohairStephen Canna Nickelson, MD  12/21/2016 4:45 PM

## 2016-12-23 LAB — GC/CHLAMYDIA PROBE AMP
Chlamydia trachomatis, NAA: NEGATIVE
NEISSERIA GONORRHOEAE BY PCR: NEGATIVE

## 2016-12-25 LAB — STREP GP B SUSCEPTIBILITY

## 2016-12-25 LAB — STREP GP B CULTURE+RFLX: STREP GP B CULTURE+RFLX: POSITIVE — AB

## 2016-12-28 ENCOUNTER — Encounter: Payer: Self-pay | Admitting: Advanced Practice Midwife

## 2016-12-28 ENCOUNTER — Ambulatory Visit (INDEPENDENT_AMBULATORY_CARE_PROVIDER_SITE_OTHER): Payer: BLUE CROSS/BLUE SHIELD | Admitting: Advanced Practice Midwife

## 2016-12-28 VITALS — BP 118/76 | Wt 209.0 lb

## 2016-12-28 DIAGNOSIS — O0990 Supervision of high risk pregnancy, unspecified, unspecified trimester: Secondary | ICD-10-CM

## 2016-12-28 DIAGNOSIS — Z3A36 36 weeks gestation of pregnancy: Secondary | ICD-10-CM

## 2016-12-28 NOTE — Patient Instructions (Signed)
Vaginal Delivery Vaginal delivery means that you will give birth by pushing your baby out of your birth canal (vagina). A team of health care providers will help you before, during, and after vaginal delivery. Birth experiences are unique for every woman and every pregnancy, and birth experiences vary depending on where you choose to give birth. What should I do to prepare for my baby's birth? Before your baby is born, it is important to talk with your health care provider about:  Your labor and delivery preferences. These may include: ? Medicines that you may be given. ? How you will manage your pain. This might include non-medical pain relief techniques or injectable pain relief such as epidural analgesia. ? How you and your baby will be monitored during labor and delivery. ? Who may be in the labor and delivery room with you. ? Your feelings about surgical delivery of your baby (cesarean delivery, or C-section) if this becomes necessary. ? Your feelings about receiving donated blood through an IV tube (blood transfusion) if this becomes necessary.  Whether you are able: ? To take pictures or videos of the birth. ? To eat during labor and delivery. ? To move around, walk, or change positions during labor and delivery.  What to expect after your baby is born, such as: ? Whether delayed umbilical cord clamping and cutting is offered. ? Who will care for your baby right after birth. ? Medicines or tests that may be recommended for your baby. ? Whether breastfeeding is supported in your hospital or birth center. ? How long you will be in the hospital or birth center.  How any medical conditions you have may affect your baby or your labor and delivery experience.  To prepare for your baby's birth, you should also:  Attend all of your health care visits before delivery (prenatal visits) as recommended by your health care provider. This is important.  Prepare your home for your baby's  arrival. Make sure that you have: ? Diapers. ? Baby clothing. ? Feeding equipment. ? Safe sleeping arrangements for you and your baby.  Install a car seat in your vehicle. Have your car seat checked by a certified car seat installer to make sure that it is installed safely.  Think about who will help you with your new baby at home for at least the first several weeks after delivery.  What can I expect when I arrive at the birth center or hospital? Once you are in labor and have been admitted into the hospital or birth center, your health care provider may:  Review your pregnancy history and any concerns you have.  Insert an IV tube into one of your veins. This is used to give you fluids and medicines.  Check your blood pressure, pulse, temperature, and heart rate (vital signs).  Check whether your bag of water (amniotic sac) has broken (ruptured).  Talk with you about your birth plan and discuss pain control options.  Monitoring Your health care provider may monitor your contractions (uterine monitoring) and your baby's heart rate (fetal monitoring). You may need to be monitored:  Often, but not continuously (intermittently).  All the time or for long periods at a time (continuously). Continuous monitoring may be needed if: ? You are taking certain medicines, such as medicine to relieve pain or make your contractions stronger. ? You have pregnancy or labor complications.  Monitoring may be done by:  Placing a special stethoscope or a handheld monitoring device on your abdomen to   check your baby's heartbeat, and feeling your abdomen for contractions. This method of monitoring does not continuously record your baby's heartbeat or your contractions.  Placing monitors on your abdomen (external monitors) to record your baby's heartbeat and the frequency and length of contractions. You may not have to wear external monitors all the time.  Placing monitors inside of your uterus  (internal monitors) to record your baby's heartbeat and the frequency, length, and strength of your contractions. ? Your health care provider may use internal monitors if he or she needs more information about the strength of your contractions or your baby's heart rate. ? Internal monitors are put in place by passing a thin, flexible wire through your vagina and into your uterus. Depending on the type of monitor, it may remain in your uterus or on your baby's head until birth. ? Your health care provider will discuss the benefits and risks of internal monitoring with you and will ask for your permission before inserting the monitors.  Telemetry. This is a type of continuous monitoring that can be done with external or internal monitors. Instead of having to stay in bed, you are able to move around during telemetry. Ask your health care provider if telemetry is an option for you.  Physical exam Your health care provider may perform a physical exam. This may include:  Checking whether your baby is positioned: ? With the head toward your vagina (head-down). This is most common. ? With the head toward the top of your uterus (head-up or breech). If your baby is in a breech position, your health care provider may try to turn your baby to a head-down position so you can deliver vaginally. If it does not seem that your baby can be born vaginally, your provider may recommend surgery to deliver your baby. In rare cases, you may be able to deliver vaginally if your baby is head-up (breech delivery). ? Lying sideways (transverse). Babies that are lying sideways cannot be delivered vaginally.  Checking your cervix to determine: ? Whether it is thinning out (effacing). ? Whether it is opening up (dilating). ? How low your baby has moved into your birth canal.  What are the three stages of labor and delivery?  Normal labor and delivery is divided into the following three stages: Stage 1  Stage 1 is the  longest stage of labor, and it can last for hours or days. Stage 1 includes: ? Early labor. This is when contractions may be irregular, or regular and mild. Generally, early labor contractions are more than 10 minutes apart. ? Active labor. This is when contractions get longer, more regular, more frequent, and more intense. ? The transition phase. This is when contractions happen very close together, are very intense, and may last longer than during any other part of labor.  Contractions generally feel mild, infrequent, and irregular at first. They get stronger, more frequent (about every 2-3 minutes), and more regular as you progress from early labor through active labor and transition.  Many women progress through stage 1 naturally, but you may need help to continue making progress. If this happens, your health care provider may talk with you about: ? Rupturing your amniotic sac if it has not ruptured yet. ? Giving you medicine to help make your contractions stronger and more frequent.  Stage 1 ends when your cervix is completely dilated to 4 inches (10 cm) and completely effaced. This happens at the end of the transition phase. Stage 2  Once   your cervix is completely effaced and dilated to 4 inches (10 cm), you may start to feel an urge to push. It is common for the body to naturally take a rest before feeling the urge to push, especially if you received an epidural or certain other pain medicines. This rest period may last for up to 1-2 hours, depending on your unique labor experience.  During stage 2, contractions are generally less painful, because pushing helps relieve contraction pain. Instead of contraction pain, you may feel stretching and burning pain, especially when the widest part of your baby's head passes through the vaginal opening (crowning).  Your health care provider will closely monitor your pushing progress and your baby's progress through the vagina during stage 2.  Your  health care provider may massage the area of skin between your vaginal opening and anus (perineum) or apply warm compresses to your perineum. This helps it stretch as the baby's head starts to crown, which can help prevent perineal tearing. ? In some cases, an incision may be made in your perineum (episiotomy) to allow the baby to pass through the vaginal opening. An episiotomy helps to make the opening of the vagina larger to allow more room for the baby to fit through.  It is very important to breathe and focus so your health care provider can control the delivery of your baby's head. Your health care provider may have you decrease the intensity of your pushing, to help prevent perineal tearing.  After delivery of your baby's head, the shoulders and the rest of the body generally deliver very quickly and without difficulty.  Once your baby is delivered, the umbilical cord may be cut right away, or this may be delayed for 1-2 minutes, depending on your baby's health. This may vary among health care providers, hospitals, and birth centers.  If you and your baby are healthy enough, your baby may be placed on your chest or abdomen to help maintain the baby's temperature and to help you bond with each other. Some mothers and babies start breastfeeding at this time. Your health care team will dry your baby and help keep your baby warm during this time.  Your baby may need immediate care if he or she: ? Showed signs of distress during labor. ? Has a medical condition. ? Was born too early (prematurely). ? Had a bowel movement before birth (meconium). ? Shows signs of difficulty transitioning from being inside the uterus to being outside of the uterus. If you are planning to breastfeed, your health care team will help you begin a feeding. Stage 3  The third stage of labor starts immediately after the birth of your baby and ends after you deliver the placenta. The placenta is an organ that develops  during pregnancy to provide oxygen and nutrients to your baby in the womb.  Delivering the placenta may require some pushing, and you may have mild contractions. Breastfeeding can stimulate contractions to help you deliver the placenta.  After the placenta is delivered, your uterus should tighten (contract) and become firm. This helps to stop bleeding in your uterus. To help your uterus contract and to control bleeding, your health care provider may: ? Give you medicine by injection, through an IV tube, by mouth, or through your rectum (rectally). ? Massage your abdomen or perform a vaginal exam to remove any blood clots that are left in your uterus. ? Empty your bladder by placing a thin, flexible tube (catheter) into your bladder. ? Encourage   you to breastfeed your baby. After labor is over, you and your baby will be monitored closely to ensure that you are both healthy until you are ready to go home. Your health care team will teach you how to care for yourself and your baby. This information is not intended to replace advice given to you by your health care provider. Make sure you discuss any questions you have with your health care provider. Document Released: 11/08/2007 Document Revised: 08/19/2015 Document Reviewed: 02/13/2015 Elsevier Interactive Patient Education  2018 [employer]  

## 2016-12-28 NOTE — Progress Notes (Signed)
No vb. No lof. NST today °

## 2016-12-28 NOTE — Progress Notes (Signed)
Routine Prenatal Care Visit  Subjective  Cynthia Diaz is a 41 y.o. G1P0000 at 3590w6d being seen today for ongoing prenatal care.  She is currently monitored for the following issues for this high-risk pregnancy and has Insomnia; Generalized anxiety disorder; GERD (gastroesophageal reflux disease); Preventative health care; Eczema of external ear; Advanced maternal age, 1st pregnancy, unspecified trimester; and Supervision of high risk pregnancy, antepartum on their problem list.  ----------------------------------------------------------------------------------- Patient reports fatigue.    . Vag. Bleeding: None.   . Denies leaking of fluid. Denies contractions. Reports good fetal movement. ----------------------------------------------------------------------------------- The following portions of the patient's history were reviewed and updated as appropriate: allergies, current medications, past family history, past medical history, past social history, past surgical history and problem list. Problem list updated.   Objective  Blood pressure 118/76, weight 209 lb (94.8 kg), last menstrual period 05/16/2016. Pregravid weight 162 lb (73.5 kg) Total Weight Gain 47 lb (21.3 kg) Urinalysis: Urine Protein: Negative Urine Glucose: Negative  Fetal Status: positive movement NST: Reactive 30 minute strip 140 bpm baseline +accelerations to 170 -decelerations  General:  Alert, oriented and cooperative. Patient is in no acute distress.  Skin: Skin is warm and dry. No rash noted.   Cardiovascular: Normal heart rate noted  Respiratory: Normal respiratory effort, no problems with respiration noted  Abdomen: Soft, gravid, appropriate for gestational age.       Pelvic:  Cervical exam deferred        Extremities: Normal range of motion.     Mental Status: Normal mood and affect. Normal behavior. Normal judgment and thought content.   Assessment   41 y.o. G1P0000 at 1390w6d by  01/19/2017, by  Ultrasound presenting for routine prenatal visit  Plan   Pregnancy #1 Problems (from 05/12/16 to present)    Problem Noted Resolved   Advanced maternal age, 1st pregnancy, unspecified trimester 07/02/2016 by Vena AustriaStaebler, Andreas, MD No   Overview Addendum 12/21/2016  3:43 PM by Conard NovakJackson, Stephen D, MD    [facility] Prenatal Labs  Dating  Blood type:     Genetic Screen 1 Screen:    AFP:     Quad:     NIPS: Antibody:   Anatomic US  Rubella:   Varicella: I  GTT Early:               Third trimester:  RPR:     Rhogam  HBsAg:     TDaP vaccine                       Flu Shot: HIV:     Baby Food                                GBS:   Contraception  Pap: 06/01/15 ASCUS HPV negative  CBB     CS/VBAC NA   Support Person         [x]  Needs growth u/s 32-34 weeks (normal) [ ]  AP testing 36 weeks w NST [ ]  deliver by 40 weeks       Generalized anxiety disorder 04/27/2015 by Doreene Nestlark, Katherine K, NP No       Preterm labor symptoms and general obstetric precautions including but not limited to vaginal bleeding, contractions, leaking of fluid and fetal movement were reviewed in detail with the patient. Please refer to After Visit Summary for other counseling recommendations.   Return in about 1 week (around 01/04/2017)  for nst/rob.  Tresea MallJane Mung Rinker, CNM  12/28/2016 3:35 PM

## 2017-01-07 ENCOUNTER — Ambulatory Visit (INDEPENDENT_AMBULATORY_CARE_PROVIDER_SITE_OTHER): Payer: BLUE CROSS/BLUE SHIELD | Admitting: Obstetrics and Gynecology

## 2017-01-07 VITALS — BP 124/82 | Wt 212.0 lb

## 2017-01-07 DIAGNOSIS — O0990 Supervision of high risk pregnancy, unspecified, unspecified trimester: Secondary | ICD-10-CM | POA: Diagnosis not present

## 2017-01-07 DIAGNOSIS — O09519 Supervision of elderly primigravida, unspecified trimester: Secondary | ICD-10-CM

## 2017-01-07 DIAGNOSIS — Z3A38 38 weeks gestation of pregnancy: Secondary | ICD-10-CM

## 2017-01-07 NOTE — Progress Notes (Signed)
ROB

## 2017-01-07 NOTE — Progress Notes (Signed)
Routine Prenatal Care Visit  Subjective  Cynthia Diaz is a 41 y.o. G1P0000 at 6984w2d being seen today for ongoing prenatal care.  She is currently monitored for the following issues for this high-risk pregnancy and has Insomnia; Generalized anxiety disorder; GERD (gastroesophageal reflux disease); Preventative health care; Eczema of external ear; Advanced maternal age, 1st pregnancy, unspecified trimester; and Supervision of high risk pregnancy, antepartum on their problem list.  ----------------------------------------------------------------------------------- Patient reports no complaints.   Contractions: Irregular. Vag. Bleeding: None.  Movement: Present. Denies leaking of fluid.  ----------------------------------------------------------------------------------- The following portions of the patient's history were reviewed and updated as appropriate: allergies, current medications, past family history, past medical history, past social history, past surgical history and problem list. Problem list updated.   Objective  Blood pressure 124/82, weight 212 lb (96.2 kg), last menstrual period 05/16/2016. Pregravid weight 162 lb (73.5 kg) Total Weight Gain 50 lb (22.7 kg) Urinalysis: Urine Protein: Negative Urine Glucose: Negative  Fetal Status: Fetal Heart Rate (bpm): 140 Fundal Height: 38 cm Movement: Present     General:  Alert, oriented and cooperative. Patient is in no acute distress.  Skin: Skin is warm and dry. No rash noted.   Cardiovascular: Normal heart rate noted  Respiratory: Normal respiratory effort, no problems with respiration noted  Abdomen: Soft, gravid, appropriate for gestational age. Pain/Pressure: Absent     Pelvic:  Cervical exam performed        Extremities: Normal range of motion.     ental Status: Normal mood and affect. Normal behavior. Normal judgment and thought content.   Baseline: 140 Variability: moderate Accelerations: present Decelerations:  absent Tocometry: n/a The patient was monitored for 30 minutes, fetal heart rate tracing was deemed reactive, category I tracing,   Assessment   41 y.o. G1P0000 at 3284w2d by  01/19/2017, by Ultrasound presenting for routine prenatal visit  Plan   Pregnancy #1 Problems (from 05/12/16 to present)    Problem Noted Resolved   Advanced maternal age, 1st pregnancy, unspecified trimester 07/02/2016 by Vena AustriaStaebler, Sherilyn Windhorst, MD No   Overview Addendum 12/21/2016  3:43 PM by Conard NovakJackson, Stephen D, MD    [facility] Prenatal Labs  Dating  Blood type:     Genetic Screen 1 Screen:    AFP:     Quad:     NIPS: Antibody:   Anatomic US  Rubella:   Varicella: I  GTT Early:               Third trimester:  RPR:     Rhogam  HBsAg:     TDaP vaccine                       Flu Shot: HIV:     Baby Food                                GBS:   Contraception  Pap: 06/01/15 ASCUS HPV negative  CBB     CS/VBAC    Support Person         [x]  Needs growth u/s 32-34 weeks (normal) [ ]  AP testing 36 weeks w NST [ ]  deliver by 40 weeks       Generalized anxiety disorder 04/27/2015 by Doreene Nestlark, Katherine K, NP No       Term labor symptoms and general obstetric precautions including but not limited to vaginal bleeding, contractions, leaking of fluid and fetal  movement were reviewed in detail with the patient. Please refer to After Visit Summary for other counseling recommendations.  - reactive NST today  Return in about 1 week (around 01/14/2017) for ROB/NST (Amos Gaber/jackson).

## 2017-01-14 ENCOUNTER — Ambulatory Visit (INDEPENDENT_AMBULATORY_CARE_PROVIDER_SITE_OTHER): Payer: BLUE CROSS/BLUE SHIELD | Admitting: Obstetrics and Gynecology

## 2017-01-14 VITALS — BP 130/80 | Wt 214.0 lb

## 2017-01-14 DIAGNOSIS — O0990 Supervision of high risk pregnancy, unspecified, unspecified trimester: Secondary | ICD-10-CM

## 2017-01-14 DIAGNOSIS — O09519 Supervision of elderly primigravida, unspecified trimester: Secondary | ICD-10-CM

## 2017-01-14 DIAGNOSIS — Z3A39 39 weeks gestation of pregnancy: Secondary | ICD-10-CM

## 2017-01-14 NOTE — Progress Notes (Signed)
Obstetric H&P   Chief Complaint: Schedule induction  Prenatal Care Provider: [facility]  History of Present Illness: 41 y.o. G1P0000 2211w2d by 01/19/2017, by Ultrasound presenting today for scheduling of IOL given AMA >40.  Irregular contractions, +FM, no LOF, no VB.  Systolic BP slightly up today repeat normal.  No headaches, vision changes, RUQ or epigastric pain.  Weight stable from last visit.  Pregravid weight 162 lb (73.5 kg) Total Weight Gain 52 lb (23.6 kg)  Clinic [facility] Prenatal Labs  Dating 12 week US Blood type: O/Positive/-- (05/21 1523)   Genetic Screen NIPS: XX diploid Antibody:Negative (05/21 1523)  Anatomic US Normal Rubella: 1.90 (05/21 1523) Varicella: Immune  GTT 77 RPR: Non Reactive (09/12 1553)   Rhogam n/a HBsAg: Negative (05/21 1523)   TDaP vaccine 11/1            Flu Shot: 10/10 HIV:   Neg  Baby Food Breast                       GBS: Positive (clindamycin resistant)  Contraception  Pap: 06/02/2015 NIL HPV negative    Review of Systems: 10 point review of systems negative unless otherwise noted in HPI  Past Medical History: Past Medical History:  Diagnosis Date  . Alcohol abuse   . Chicken pox   . Elevated blood pressure   . Generalized anxiety disorder   . Kidney stones     Past Surgical History: Past Surgical History:  Procedure Laterality Date  . COLPOSCOPY  2005  . KNEE SURGERY Left   . WISDOM TOOTH EXTRACTION     Family History: Family History  Problem Relation Age of Onset  . Arthritis Father   . Hyperlipidemia Father   . Hypertension Father   . Breast cancer Maternal Aunt     Social History: Social History   Socioeconomic History  . Marital status: Single    Spouse name: Not on file  . Number of children: Not on file  . Years of education: Not on file  . Highest education level: Not on file  Social Needs  . Financial resource strain: Not on file  . Food insecurity - worry: Not on file  . Food insecurity - inability: Not on file    . Transportation needs - medical: Not on file  . Transportation needs - non-medical: Not on file  Occupational History  . Not on file  Tobacco Use  . Smoking status: Never Smoker  . Smokeless tobacco: Never Used  Substance and Sexual Activity  . Alcohol use: No    Alcohol/week: 0.0 oz  . Drug use: No  . Sexual activity: Yes  Other Topics Concern  . 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   Enjoys exercising, running, playing soccer, art, gardening.    Medications: Prior to Admission medications   Medication Sig Start Date End Date Taking? Authorizing Provider  busPIRone (BUSPAR) 10 MG tablet TAKE 1 TABLET (10 MG TOTAL) BY MOUTH 2 (TWO) TIMES DAILY. 10/16/16   Doreene Nestlark, Katherine K, NP  sertraline (ZOLOFT) 100 MG tablet Take 1 tablet (100 mg total) by mouth daily. 11/06/16   Nadara MustardHarris, Robert P, MD  traZODone (DESYREL) 50 MG tablet TAKE 0.5-1 TABLETS (25-50 MG TOTAL) BY MOUTH AT BEDTIME AS NEEDED FOR SLEEP. 12/11/16   Doreene Nestlark, Katherine K, NP    Allergies: Allergies  Allergen Reactions  . Amoxicillin  Hives  . Penicillins Hives    Physical Exam: Vitals: Blood pressure 140/82, weight 214 lb (97.1 kg), last menstrual period 05/16/2016.  Urine Dip Protein: negative  FHT: 150  General: NAD HEENT: normocephalic, anicteric Pulmonary: No increased work of breathing Cardiovascular: RRR, distal pulses 2+ Abdomen: Gravid, non-tender Leopolds: vtx 8lbs Genitourinary: cervix closed Extremities: no edema, erythema, or tenderness Neurologic: Grossly intact Psychiatric: mood appropriate, affect full  Labs: No results found for this or any previous visit (from the past 24 hour(s)).  Assessment: 41 y.o. G1P0000 6420w2d by 01/19/2017, by Ultrasound presenting for ROB and IOL scheduling  Plan: 1) IOL - scheduled for 01/18/17 cervidil IOL for AMA >40  2) Fetus - +FHT today  3) PNL - see HPI  4) Immunization History -   Immunization History  Administered Date(s) Administered  . Influenza,inj,Quad PF,6+ Mos 11/21/2016  . Tdap 05/16/2015    5) Disposition - IOL on 01/18/17

## 2017-01-14 NOTE — Progress Notes (Signed)
  [facility]UL[facility] Cynthia ParisianKatharine S Jankowski 14-Dec-1975 Medical record #: [id number] Phone #:   Home Phone [phone]  Mobile [phone]    Prenatal Provider:"[facility] Delivering Group:[facility] Proposed admission date/time:01/18/2017 Method of induction:Cytotec  Weight: Filed Weights12/03/18 0917Weight:214 lb (97.1 kg) BMI Body mass index is 32.54 kg/m.,". HIV Negative HSV Negative EDC Estimated Date of Delivery: 12/8/18based on:LMP  Gestational age on admission: 5370w6d Gravidity/parity:G1P0000  Cervix Score   0 1 2 3   Position Posterior Midposition Anterior   Consistency Firm Medium Soft   Effacement (%) 0-30 40-50 60-70 >80  Dilation (cm) Closed 1-2 3-4 >5  Baby's station -3 -2 -1 +1, +2   Bishop Score:3   Medical induction of labor  select indication(s) below Elective induction ?39 weeks multiparous patient ?39 weeks primiparous patient with Bishop score ?7 ?40 weeks primiparous patient  AMA with patient age >40 years at time of delivery  Medical Indications Adapted from ACOG Committee Opinion #560, "Medically Indicated Late Preterm and Early Term Deliveries," 2013.  PLACENTAL / UTERINE ISSUES FETAL ISSUES MATERNAL ISSUES  ? Placenta previa (36.0-37.6) ? Isoimmunization (37.0-38.6) ? Preeclampsia without severe features or gestational HTN (37.0)  ? Suspected accreta (34.0-35.6) ? Growth Restriction [name] ? Preeclampsia with severe features (34.0)  ? Prior classical CD, uterine window, rupture (36.0-37.6) ? Isolated (38.0-39.6) ? Chronic HTN (38.0-39.6)  ? Prior myomectomy (37.0-38.6) ? Concurrent findings (34.0-37.6) ? Cholestasis (37.0)  ? Umbilical vein varix (37.0) ? Growth Restriction (Twins) ? Diabetes  ? Placental abruption (chronic) ? Di-Di Isolated (36.0-37.6) ? Pregestational, controlled (39.0)  OBSTETRIC ISSUES ? Di-Di concurrent findings (32.0-34.6) ? Pregestational, uncontrolled (37.0-39.0)  ? Postdates ?  (41 weeks) ? Mo-Di isolated (32.0-34.6) ? Pregestational, vascular compromise (37.0- 39.0)  ? PPROM (34.0) ? Multiple Gestation ? Gestational, diet controlled (40.0)  ? Hx of IUFD (39.0 weeks) ? Di-Di (38.0-38.6) ? Gestational, med controlled (39.0)  ? Polyhydramnios, mild/moderate; SDV 8-16 or AFI 25-35 (39.0) ? Mo-Di (36.0-37.6) ? Gestational, uncontrolled (38.0-39.0)  ? Oligohydramnios (36.0-37.6); MVP <2 cm  For indications not listed above, delivery recommendations from maternal-fetal medicine consultant occurred on: Date:N.A with Dr. N/A for indication of:N/A  Provider Signature: Vena Austriandreas Karma Ansley Scheduled by:AMS  Date:01/14/2017 9:26 AM   Call [phone] to finalize the induction date/time  [id number] (07/17)

## 2017-01-17 ENCOUNTER — Encounter: Payer: Self-pay | Admitting: Obstetrics and Gynecology

## 2017-01-18 ENCOUNTER — Inpatient Hospital Stay
Admission: EM | Admit: 2017-01-18 | Discharge: 2017-01-22 | DRG: 806 | Disposition: A | Discharge status: Home / Self Care | Payer: BLUE CROSS/BLUE SHIELD | Attending: Obstetrics and Gynecology | Admitting: Obstetrics and Gynecology

## 2017-01-18 ENCOUNTER — Other Ambulatory Visit: Payer: Self-pay

## 2017-01-18 ENCOUNTER — Encounter: Payer: Self-pay | Admitting: *Deleted

## 2017-01-18 DIAGNOSIS — Z3A39 39 weeks gestation of pregnancy: Secondary | ICD-10-CM

## 2017-01-18 DIAGNOSIS — O134 Gestational [pregnancy-induced] hypertension without significant proteinuria, complicating childbirth: Principal | ICD-10-CM | POA: Diagnosis present

## 2017-01-18 DIAGNOSIS — F411 Generalized anxiety disorder: Secondary | ICD-10-CM

## 2017-01-18 DIAGNOSIS — O09519 Supervision of elderly primigravida, unspecified trimester: Secondary | ICD-10-CM

## 2017-01-18 DIAGNOSIS — F419 Anxiety disorder, unspecified: Secondary | ICD-10-CM | POA: Diagnosis present

## 2017-01-18 DIAGNOSIS — D62 Acute posthemorrhagic anemia: Secondary | ICD-10-CM | POA: Diagnosis not present

## 2017-01-18 DIAGNOSIS — O99344 Other mental disorders complicating childbirth: Secondary | ICD-10-CM | POA: Diagnosis present

## 2017-01-18 DIAGNOSIS — F329 Major depressive disorder, single episode, unspecified: Secondary | ICD-10-CM | POA: Diagnosis present

## 2017-01-18 DIAGNOSIS — O9081 Anemia of the puerperium: Secondary | ICD-10-CM | POA: Diagnosis not present

## 2017-01-18 LAB — COMPREHENSIVE METABOLIC PANEL
ALK PHOS: 136 U/L — AB (ref 38–126)
ALT: 12 U/L — AB (ref 14–54)
AST: 21 U/L (ref 15–41)
Albumin: 3 g/dL — ABNORMAL LOW (ref 3.5–5.0)
Anion gap: 10 (ref 5–15)
BILIRUBIN TOTAL: 0.3 mg/dL (ref 0.3–1.2)
BUN: 11 mg/dL (ref 6–20)
CALCIUM: 10.1 mg/dL (ref 8.9–10.3)
CHLORIDE: 105 mmol/L (ref 101–111)
CO2: 21 mmol/L — ABNORMAL LOW (ref 22–32)
CREATININE: 0.81 mg/dL (ref 0.44–1.00)
Glucose, Bld: 127 mg/dL — ABNORMAL HIGH (ref 65–99)
Potassium: 3.4 mmol/L — ABNORMAL LOW (ref 3.5–5.1)
Sodium: 136 mmol/L (ref 135–145)
TOTAL PROTEIN: 6.4 g/dL — AB (ref 6.5–8.1)

## 2017-01-18 LAB — PROTEIN / CREATININE RATIO, URINE
CREATININE, URINE: 22 mg/dL
Total Protein, Urine: <6 mg/dL

## 2017-01-18 LAB — CBC
HEMATOCRIT: 35 % (ref 35.0–47.0)
Hemoglobin: 12.3 g/dL (ref 12.0–16.0)
MCH: 32.5 pg (ref 26.0–34.0)
MCHC: 35 g/dL (ref 32.0–36.0)
MCV: 92.7 fL (ref 80.0–100.0)
PLATELETS: 264 10*3/uL (ref 150–440)
RBC: 3.77 MIL/uL — ABNORMAL LOW (ref 3.80–5.20)
RDW: 13.7 % (ref 11.5–14.5)
WBC: 8.6 10*3/uL (ref 3.6–11.0)

## 2017-01-18 LAB — TYPE AND SCREEN
ABO/RH(D): O POS
Antibody Screen: NEGATIVE

## 2017-01-18 MED ORDER — LIDOCAINE HCL (PF) 1 % IJ SOLN
30.0000 mL | INTRAMUSCULAR | Status: DC | PRN
  Administered 2017-01-20: 30 mL via SUBCUTANEOUS
  Filled 2017-01-18: qty 30

## 2017-01-18 MED ORDER — ONDANSETRON HCL 4 MG/2ML IJ SOLN: 4.0000 mg | Freq: Four times a day (QID) | INTRAMUSCULAR | Status: DC | PRN

## 2017-01-18 MED ORDER — LACTATED RINGERS IV SOLN
INTRAVENOUS | Status: DC
  Administered 2017-01-18 (×2): via INTRAVENOUS
  Administered 2017-01-19: 1000 mL via INTRAVENOUS

## 2017-01-18 MED ORDER — TERBUTALINE SULFATE 1 MG/ML IJ SOLN: 0.2500 mg | Freq: Once | INTRAMUSCULAR | Status: DC | PRN

## 2017-01-18 MED ORDER — AMMONIA AROMATIC IN INHA
RESPIRATORY_TRACT | Status: AC
  Filled 2017-01-18: qty 10

## 2017-01-18 MED ORDER — TRAZODONE HCL 50 MG PO TABS
50.0000 mg | ORAL_TABLET | Freq: Every day | ORAL | Status: DC
  Administered 2017-01-18 – 2017-01-19 (×2): 50 mg via ORAL
  Filled 2017-01-18 (×3): qty 1

## 2017-01-18 MED ORDER — BUSPIRONE HCL 5 MG PO TABS
10.0000 mg | ORAL_TABLET | Freq: Two times a day (BID) | ORAL | Status: DC
  Administered 2017-01-18 – 2017-01-20 (×3): 10 mg via ORAL
  Filled 2017-01-18 (×2): qty 2
  Filled 2017-01-18: qty 1
  Filled 2017-01-18 (×3): qty 2

## 2017-01-18 MED ORDER — OXYTOCIN 40 UNITS IN LACTATED RINGERS INFUSION - SIMPLE MED
1.0000 m[IU]/min | INTRAVENOUS | Status: DC
  Administered 2017-01-18 – 2017-01-19 (×2): 2 m[IU]/min via INTRAVENOUS
  Administered 2017-01-19: 18 m[IU]/min via INTRAVENOUS
  Administered 2017-01-19: 16 m[IU]/min via INTRAVENOUS
  Administered 2017-01-20: 2 m[IU]/min via INTRAVENOUS
  Filled 2017-01-18: qty 1000

## 2017-01-18 MED ORDER — MISOPROSTOL 25 MCG QUARTER TABLET
25.0000 ug | ORAL_TABLET | ORAL | Status: DC | PRN
  Administered 2017-01-18 – 2017-01-20 (×4): 25 ug via VAGINAL
  Filled 2017-01-18 (×4): qty 1

## 2017-01-18 MED ORDER — OXYTOCIN 40 UNITS IN LACTATED RINGERS INFUSION - SIMPLE MED
2.5000 [IU]/h | INTRAVENOUS | Status: DC
  Administered 2017-01-20: 2.5 [IU]/h via INTRAVENOUS
  Filled 2017-01-18 (×2): qty 1000

## 2017-01-18 MED ORDER — LACTATED RINGERS IV SOLN: 500.0000 mL | INTRAVENOUS | Status: DC | PRN

## 2017-01-18 MED ORDER — ACETAMINOPHEN 325 MG PO TABS
650.0000 mg | ORAL_TABLET | ORAL | Status: DC | PRN
  Filled 2017-01-18: qty 2

## 2017-01-18 MED ORDER — OXYTOCIN BOLUS FROM INFUSION: 500.0000 mL | Freq: Once | INTRAVENOUS | Status: DC

## 2017-01-18 MED ORDER — DEXTROSE 5 % IV SOLN
2.0000 g | Freq: Once | INTRAVENOUS | Status: AC
  Administered 2017-01-18: 2 g via INTRAVENOUS
  Filled 2017-01-18: qty 2000

## 2017-01-18 MED ORDER — MISOPROSTOL 200 MCG PO TABS
ORAL_TABLET | ORAL | Status: AC
  Administered 2017-01-19: 25 ug via VAGINAL
  Filled 2017-01-18: qty 4

## 2017-01-18 MED ORDER — CEFAZOLIN SODIUM-DEXTROSE 1-4 GM/50ML-% IV SOLN
1.0000 g | Freq: Three times a day (TID) | INTRAVENOUS | Status: DC
  Administered 2017-01-18 – 2017-01-20 (×5): 1 g via INTRAVENOUS
  Filled 2017-01-18 (×7): qty 50

## 2017-01-18 MED ORDER — SOD CITRATE-CITRIC ACID 500-334 MG/5ML PO SOLN: 30.0000 mL | ORAL | Status: DC | PRN

## 2017-01-18 MED ORDER — OXYTOCIN 40 UNITS IN LACTATED RINGERS INFUSION - SIMPLE MED
INTRAVENOUS | Status: AC
  Administered 2017-01-18: 2 m[IU]/min via INTRAVENOUS
  Filled 2017-01-18: qty 1000

## 2017-01-18 NOTE — Progress Notes (Signed)
   Subjective:  Comfortable, increasing contraction pattern  Objective:   Vitals: Blood pressure (!) 141/85, pulse 70, temperature 97.9 F (36.6 C), temperature source Oral, resp. rate 18, height 5\' 9"  (1.753 m), weight 214 lb (97.1 kg), last menstrual period 05/16/2016. General: NAD Abdomen: gravid, non-tender Cervical Exam:  Dilation: 1 Effacement (%): 50 Cervical Position: Middle Station: -2 Exam by:: jac  FHT: 150, moderate, +accels, no decels Toco: q2-623min  Results for orders placed or performed during the hospital encounter of 01/18/17 (from the past 24 hour(s))  Type and screen [facility]     Status: None   Collection Time: 01/18/17  9:25 AM  Result Value Ref Range   ABO/RH(D) O POS    Antibody Screen NEG    Sample Expiration 01/21/2017   CBC     Status: Abnormal   Collection Time: 01/18/17  9:29 AM  Result Value Ref Range   WBC 8.6 3.6 - 11.0 K/uL   RBC 3.77 (L) 3.80 - 5.20 MIL/uL   Hemoglobin 12.3 12.0 - 16.0 g/dL   HCT 16.135.0 09.635.0 - 04.547.0 %   MCV 92.7 80.0 - 100.0 fL   MCH 32.5 26.0 - 34.0 pg   MCHC 35.0 32.0 - 36.0 g/dL   RDW 40.913.7 81.111.5 - 91.414.5 %   Platelets 264 150 - 440 K/uL  Comprehensive metabolic panel     Status: Abnormal   Collection Time: 01/18/17  9:29 AM  Result Value Ref Range   Sodium 136 135 - 145 mmol/L   Potassium 3.4 (L) 3.5 - 5.1 mmol/L   Chloride 105 101 - 111 mmol/L   CO2 21 (L) 22 - 32 mmol/L   Glucose, Bld 127 (H) 65 - 99 mg/dL   BUN 11 6 - 20 mg/dL   Creatinine, Ser 7.820.81 0.44 - 1.00 mg/dL   Calcium 95.610.1 8.9 - 21.310.3 mg/dL   Total Protein 6.4 (L) 6.5 - 8.1 g/dL   Albumin 3.0 (L) 3.5 - 5.0 g/dL   AST 21 15 - 41 U/L   ALT 12 (L) 14 - 54 U/L   Alkaline Phosphatase 136 (H) 38 - 126 U/L   Total Bilirubin 0.3 0.3 - 1.2 mg/dL   GFR calc non Af Amer >60 >60 mL/min   GFR calc Af Amer >60 >60 mL/min   Anion gap 10 5 - 15  Protein / creatinine ratio, urine     Status: None   Collection Time: 01/18/17  1:24 PM  Result  Value Ref Range   Creatinine, Urine 22 mg/dL   Total Protein, Urine <6 mg/dL   Protein Creatinine Ratio        0.00 - 0.15 mg/mg[Cre]    Assessment:   41 y.o. G1P0000 5553w6d IOL for AMA >41 years of age, and GHTN noted on admission  Plan:   1) Labor - just had cytotec placed switch to pitocin after 4-hrs, unable to place cervical foley secondary to low station  2) Fetus - cat I tracing  3) GHTN - labs pending

## 2017-01-18 NOTE — H&P (Signed)
Date of Initial H&P: 01/14/2017  History reviewed, patient examined, no change in status, proceed with IOL

## 2017-01-18 NOTE — Progress Notes (Signed)
   Subjective:  Doing well, feeling some contractions, good frequency but only rated 2/10  Objective:   Vitals: Blood pressure 139/84, pulse 79, temperature 98.4 F (36.9 C), temperature source Oral, resp. rate 18, height 5\' 9"  (1.753 m), weight 214 lb (97.1 kg), last menstrual period 05/16/2016. General:  Abdomen: Cervical Exam:  Dilation: 2.5 Effacement (%): 70 Cervical Position: Middle Station: -1 Exam by:: AMS  FHT: 135, moderate, +accels, no decels Toco: q1-533min  Results for orders placed or performed during the hospital encounter of 01/18/17 (from the past 24 hour(s))  Type and screen [facility]     Status: None   Collection Time: 01/18/17  9:25 AM  Result Value Ref Range   ABO/RH(D) O POS    Antibody Screen NEG    Sample Expiration 01/21/2017   CBC     Status: Abnormal   Collection Time: 01/18/17  9:29 AM  Result Value Ref Range   WBC 8.6 3.6 - 11.0 K/uL   RBC 3.77 (L) 3.80 - 5.20 MIL/uL   Hemoglobin 12.3 12.0 - 16.0 g/dL   HCT 40.135.0 02.735.0 - 25.347.0 %   MCV 92.7 80.0 - 100.0 fL   MCH 32.5 26.0 - 34.0 pg   MCHC 35.0 32.0 - 36.0 g/dL   RDW 66.413.7 40.311.5 - 47.414.5 %   Platelets 264 150 - 440 K/uL  Comprehensive metabolic panel     Status: Abnormal   Collection Time: 01/18/17  9:29 AM  Result Value Ref Range   Sodium 136 135 - 145 mmol/L   Potassium 3.4 (L) 3.5 - 5.1 mmol/L   Chloride 105 101 - 111 mmol/L   CO2 21 (L) 22 - 32 mmol/L   Glucose, Bld 127 (H) 65 - 99 mg/dL   BUN 11 6 - 20 mg/dL   Creatinine, Ser 2.590.81 0.44 - 1.00 mg/dL   Calcium 56.310.1 8.9 - 87.510.3 mg/dL   Total Protein 6.4 (L) 6.5 - 8.1 g/dL   Albumin 3.0 (L) 3.5 - 5.0 g/dL   AST 21 15 - 41 U/L   ALT 12 (L) 14 - 54 U/L   Alkaline Phosphatase 136 (H) 38 - 126 U/L   Total Bilirubin 0.3 0.3 - 1.2 mg/dL   GFR calc non Af Amer >60 >60 mL/min   GFR calc Af Amer >60 >60 mL/min   Anion gap 10 5 - 15  Protein / creatinine ratio, urine     Status: None   Collection Time: 01/18/17  1:24 PM    Result Value Ref Range   Creatinine, Urine 22 mg/dL   Total Protein, Urine <6 mg/dL   Protein Creatinine Ratio        0.00 - 0.15 mg/mg[Cre]    Assessment:   41 y.o. G1P0000 2349w6d IOL AMA and GHTN  Plan:   1) Labor - switch to pitocin contracting to frequently for cytotec  2) Fetus - cat I  3) GHTN - normotensive to mild range BP, still asymptomatic

## 2017-01-19 LAB — RPR: RPR: NONREACTIVE

## 2017-01-19 MED ORDER — HYDROXYZINE HCL 25 MG PO TABS
25.0000 mg | ORAL_TABLET | Freq: Four times a day (QID) | ORAL | Status: DC | PRN
  Filled 2017-01-19: qty 1

## 2017-01-19 MED ORDER — SODIUM CHLORIDE 0.9 % IJ SOLN
INTRAMUSCULAR | Status: AC
  Filled 2017-01-19: qty 50

## 2017-01-19 NOTE — Progress Notes (Signed)
Patient ID: [name], female   DOB: 01-05-1976, 41 y.o.   MRN: [id number] Rechecked cervix. Minimal change, slightly more anterior position. Will stop pitocin and do cytotec overnight. Discussed with patient and nursing. Patient okay for regular diet at this time. FHR 130s baseline, moderate variability, no decels Toco: palpated every 2 by nursing. Dorette Hartel 01/19/17 10:47 PM

## 2017-01-19 NOTE — Progress Notes (Signed)
   Subjective:  Increasing intensity of contractions 4/10 from 2/10 when we started pitocin.  Otherwise doing well.  Objective:   Vitals: Blood pressure (!) 116/58, pulse 72, temperature 98.6 F (37 C), temperature source Oral, resp. rate 18, height 5\' 9"  (1.753 m), weight 214 lb (97.1 kg), last menstrual period 05/16/2016. General:  Abdomen: Cervical Exam:  Dilation: 3 Effacement (%): 70 Cervical Position: Middle Station: -1 Exam by:: AMS  FHT: 130, moderate, +accels, no decels Toco: q422min  Results for orders placed or performed during the hospital encounter of 01/18/17 (from the past 24 hour(s))  Type and screen [facility]     Status: None   Collection Time: 01/18/17  9:25 AM  Result Value Ref Range   ABO/RH(D) O POS    Antibody Screen NEG    Sample Expiration 01/21/2017   CBC     Status: Abnormal   Collection Time: 01/18/17  9:29 AM  Result Value Ref Range   WBC 8.6 3.6 - 11.0 K/uL   RBC 3.77 (L) 3.80 - 5.20 MIL/uL   Hemoglobin 12.3 12.0 - 16.0 g/dL   HCT 16.135.0 09.635.0 - 04.547.0 %   MCV 92.7 80.0 - 100.0 fL   MCH 32.5 26.0 - 34.0 pg   MCHC 35.0 32.0 - 36.0 g/dL   RDW 40.913.7 81.111.5 - 91.414.5 %   Platelets 264 150 - 440 K/uL  Comprehensive metabolic panel     Status: Abnormal   Collection Time: 01/18/17  9:29 AM  Result Value Ref Range   Sodium 136 135 - 145 mmol/L   Potassium 3.4 (L) 3.5 - 5.1 mmol/L   Chloride 105 101 - 111 mmol/L   CO2 21 (L) 22 - 32 mmol/L   Glucose, Bld 127 (H) 65 - 99 mg/dL   BUN 11 6 - 20 mg/dL   Creatinine, Ser 7.820.81 0.44 - 1.00 mg/dL   Calcium 95.610.1 8.9 - 21.310.3 mg/dL   Total Protein 6.4 (L) 6.5 - 8.1 g/dL   Albumin 3.0 (L) 3.5 - 5.0 g/dL   AST 21 15 - 41 U/L   ALT 12 (L) 14 - 54 U/L   Alkaline Phosphatase 136 (H) 38 - 126 U/L   Total Bilirubin 0.3 0.3 - 1.2 mg/dL   GFR calc non Af Amer >60 >60 mL/min   GFR calc Af Amer >60 >60 mL/min   Anion gap 10 5 - 15  Protein / creatinine ratio, urine     Status: None   Collection Time:  01/18/17  1:24 PM  Result Value Ref Range   Creatinine, Urine 22 mg/dL   Total Protein, Urine <6 mg/dL   Protein Creatinine Ratio        0.00 - 0.15 mg/mg[Cre]    Assessment:   41 y.o. G1P0000 7134w0d IOL AMA >40, GHTN   Plan:   1) Labor - contractions increasing, tight 3cm  2) Fetus - cat I tracing  3) GHTN - normotensive currently

## 2017-01-19 NOTE — Progress Notes (Signed)
Patient had a pitocin break, lunch, and a shower. Recheck showed 3/50/-3. Foley bulb placed, but with traction was pulled out of the cervix. Restarting pitocin. Will continue with Induction. Adelene Idlerhristanna Eyleen Rawlinson MD  01/19/17 4:51 PM

## 2017-01-20 ENCOUNTER — Inpatient Hospital Stay: Payer: BLUE CROSS/BLUE SHIELD | Admitting: Anesthesiology

## 2017-01-20 DIAGNOSIS — Z3A39 39 weeks gestation of pregnancy: Secondary | ICD-10-CM

## 2017-01-20 DIAGNOSIS — O134 Gestational [pregnancy-induced] hypertension without significant proteinuria, complicating childbirth: Secondary | ICD-10-CM

## 2017-01-20 MED ORDER — FENTANYL 2.5 MCG/ML W/ROPIVACAINE 0.15% IN NS 100 ML EPIDURAL (ARMC)
EPIDURAL | Status: AC
  Filled 2017-01-20: qty 100

## 2017-01-20 MED ORDER — PHENYLEPHRINE 40 MCG/ML (10ML) SYRINGE FOR IV PUSH (FOR BLOOD PRESSURE SUPPORT)
80.0000 ug | PREFILLED_SYRINGE | INTRAVENOUS | Status: DC | PRN
  Filled 2017-01-20: qty 5

## 2017-01-20 MED ORDER — LACTATED RINGERS IV SOLN: 500.0000 mL | Freq: Once | INTRAVENOUS | Status: DC

## 2017-01-20 MED ORDER — FENTANYL 2.5 MCG/ML W/ROPIVACAINE 0.15% IN NS 100 ML EPIDURAL (ARMC)
12.0000 mL/h | EPIDURAL | Status: DC
  Administered 2017-01-20: 12 mL/h via EPIDURAL

## 2017-01-20 MED ORDER — SODIUM CHLORIDE 0.9 % IV SOLN
INTRAVENOUS | Status: DC | PRN
  Administered 2017-01-20 (×3): 5 mL via EPIDURAL

## 2017-01-20 MED ORDER — LIDOCAINE-EPINEPHRINE (PF) 1.5 %-1:200000 IJ SOLN
INTRAMUSCULAR | Status: DC | PRN
  Administered 2017-01-20: 3 mL

## 2017-01-20 MED ORDER — EPHEDRINE 5 MG/ML INJ
10.0000 mg | INTRAVENOUS | Status: DC | PRN
  Filled 2017-01-20: qty 2

## 2017-01-20 NOTE — Anesthesia Preprocedure Evaluation (Signed)
Anesthesia Evaluation  Patient identified by MRN, date of birth, ID band Patient awake    Reviewed: Allergy & Precautions, NPO status , Patient's Chart, lab work & pertinent test results  History of Anesthesia Complications Negative for: history of anesthetic complications  Airway Mallampati: II  TM Distance: >3 FB Neck ROM: Full    Dental no notable dental hx.    Pulmonary neg pulmonary ROS, neg sleep apnea, neg COPD,    breath sounds clear to auscultation- rhonchi (-) wheezing      Cardiovascular Exercise Tolerance: Good (-) hypertension(-) CAD, (-) Past MI and (-) Cardiac Stents  Rhythm:Regular Rate:Normal - Systolic murmurs and - Diastolic murmurs    Neuro/Psych PSYCHIATRIC DISORDERS Anxiety negative neurological ROS     GI/Hepatic Neg liver ROS, GERD  ,  Endo/Other  negative endocrine ROSneg diabetes  Renal/GU Renal disease: hx of nephrolithiasis.     Musculoskeletal negative musculoskeletal ROS (+)   Abdominal (+) - obese, Gravid abdomen  Peds  Hematology negative hematology ROS (+)   Anesthesia Other Findings Past Medical History: No date: Alcohol abuse No date: Chicken pox No date: Elevated blood pressure No date: Generalized anxiety disorder No date: Kidney stones   Reproductive/Obstetrics (+) Pregnancy                             Lab Results  Component Value Date   WBC 8.6 01/18/2017   HGB 12.3 01/18/2017   HCT 35.0 01/18/2017   MCV 92.7 01/18/2017   PLT 264 01/18/2017    Anesthesia Physical Anesthesia Plan  ASA: II  Anesthesia Plan: Epidural   Post-op Pain Management:    Induction:   PONV Risk Score and Plan: 2  Airway Management Planned:   Additional Equipment:   Intra-op Plan:   Post-operative Plan:   Informed Consent: I have reviewed the patients History and Physical, chart, labs and discussed the procedure including the risks, benefits and  alternatives for the proposed anesthesia with the patient or authorized representative who has indicated his/her understanding and acceptance.     Plan Discussed with: CRNA and Anesthesiologist  Anesthesia Plan Comments: (Plan for epidural for labor, discussed epidural vs spinal vs GA if need for csection)        Anesthesia Quick Evaluation

## 2017-01-20 NOTE — Anesthesia Procedure Notes (Signed)
Epidural Patient location during procedure: OB Start time: 01/20/2017 5:40 PM End time: 01/20/2017 5:59 PM  Staffing Anesthesiologist: Alver FisherPenwarden, Kyian Obst, MD Performed: anesthesiologist   Preanesthetic Checklist Completed: patient identified, site marked, surgical consent, pre-op evaluation, timeout performed, IV checked, risks and benefits discussed and monitors and equipment checked  Epidural Patient position: sitting Prep: ChloraPrep Patient monitoring: heart rate, continuous pulse ox and blood pressure Approach: midline Location: L3-L4 Injection technique: LOR saline  Needle:  Needle type: Tuohy  Needle gauge: 18 G Needle length: 9 cm and 9 Needle insertion depth: 5 cm Catheter type: closed end flexible Catheter size: 20 Guage Catheter at skin depth: 9 cm Test dose: negative (0.125% bupivacaine)  Assessment Events: blood not aspirated, injection not painful, no injection resistance, negative IV test and no paresthesia  Additional Notes   Patient tolerated the insertion well without complications.Reason for block:procedure for pain

## 2017-01-20 NOTE — Progress Notes (Signed)
Starting around 1529 patient was standing, swaying and moving had all over belly continuously moving belly bands. She was asking for monitors to be removed and take a shower. RN in intermitently to repositon monitors, removed monitor with MD permission at 1656. Talked patient back into monitor at 1730 still standing and swaying and moving hands over belly. Sat down on bed at 1745 for epidural. While sitting up monior slid down belly and was tracing close to maternal. Patient laid back on bed at 1752 rn repositioned toco , patient moving rn hand away due to pain. rn called for sencond rn at 1758, MD called to room at 1803, entered room at 1804 for delivery.

## 2017-01-20 NOTE — Discharge Instructions (Signed)
Before [facility] Before your baby arrives it is important to:  Have all of the supplies that you will need to care for your baby.  Know where to go if there is an emergency.  Discuss the baby's arrival with other family members.  What supplies will I need?  It is recommended that you have the following supplies: Large Items  Crib.  Crib mattress.  Rear-facing infant car seat. If possible, have a trained professional check to make sure that it is installed correctly.  Feeding  6-8 bottles that are 4-5 oz in size.  6-8 nipples.  Bottle brush.  Sterilizer, or a large pan or kettle with a lid.  A way to boil and cool water.  If you will be breastfeeding: ? Breast pump. ? Nipple cream. ? Nursing bra. ? Breast pads. ? Breast shields.  If you will be formula feeding: ? Formula. ? Measuring cups. ? Measuring spoons.  Bathing  Mild baby soap and baby shampoo.  Petroleum jelly.  Soft cloth towel and washcloth.  Hooded towel.  Cotton balls.  Bath basin.  Other Supplies  Rectal thermometer.  Bulb syringe.  Baby wipes or washcloths for diaper changes.  Diaper bag.  Changing pad.  Clothing, including one-piece outfits and pajamas.  Baby nail clippers.  Receiving blankets.  Mattress pad and sheets for the crib.  Night-light for the babys room.  Baby monitor.  2 or 3 pacifiers.  Either 24-36 cloth diapers and waterproof diaper covers or a box of disposable diapers. You may need to use as many as 10-12 diapers per day.  How do I prepare for an emergency? Prepare for an emergency by:  Knowing how to get to the nearest hospital.  Listing the phone numbers of your baby's health care providers near your home phone and in your cell phone.  How do I prepare my family?  Decide how to handle visitors.  If you have other children: ? Talk with them about the baby coming home. Ask them how they feel about it. ? Read a book together about  being a new big brother or sister. ? Find ways to let them help you prepare for the new baby. ? Have someone ready to care for them while you are in the hospital. This information is not intended to replace advice given to you by your health care provider. Make sure you discuss any questions you have with your health care provider. Document Released: 01/12/2008 Document Revised: 07/07/2015 Document Reviewed: 01/06/2014 Elsevier Interactive Patient Education  2018 [employer]  Vaginal Delivery, Care After Refer to this sheet in the next few weeks. These instructions provide you with information about caring for yourself after vaginal delivery. Your health care provider may also give you more specific instructions. Your treatment has been planned according to current medical practices, but problems sometimes occur. Call your health care provider if you have any problems or questions. What can I expect after the procedure? After vaginal delivery, it is common to have:  Some bleeding from your vagina.  Soreness in your abdomen, your vagina, and the area of skin between your vaginal opening and your anus (perineum).  Pelvic cramps.  Fatigue.  Follow these instructions at home: Medicines  Take over-the-counter and prescription medicines only as told by your health care provider.  If you were prescribed an antibiotic medicine, take it as told by your health care provider. Do not stop taking the antibiotic until it is finished. Driving   Do  not drive or operate heavy machinery while taking prescription pain medicine.  Do not drive for 24 hours if you received a sedative. Lifestyle  Do not drink alcohol. This is especially important if you are breastfeeding or taking medicine to relieve pain.  Do not use tobacco products, including cigarettes, chewing tobacco, or e-cigarettes. If you need help quitting, ask your health care provider. Eating and drinking  Drink at least 8 eight-ounce  glasses of water every day unless you are told not to by your health care provider. If you choose to breastfeed your baby, you may need to drink more water than this.  Eat high-fiber foods every day. These foods may help prevent or relieve constipation. High-fiber foods include: ? Whole grain cereals and breads. ? Brown rice. ? Beans. ? Fresh fruits and vegetables. Activity  Return to your normal activities as told by your health care provider. Ask your health care provider what activities are safe for you.  Rest as much as possible. Try to rest or take a nap when your baby is sleeping.  Do not lift anything that is heavier than your baby or 10 lb (4.5 kg) until your health care provider says that it is safe.  Talk with your health care provider about when you can engage in sexual activity. This may depend on your: ? Risk of infection. ? Rate of healing. ? Comfort and desire to engage in sexual activity. Vaginal Care  If you have an episiotomy or a vaginal tear, check the area every day for signs of infection. Check for: ? More redness, swelling, or pain. ? More fluid or blood. ? Warmth. ? Pus or a bad smell.  Do not use tampons or douches until your health care provider says this is safe.  Watch for any blood clots that may pass from your vagina. These may look like clumps of dark red, brown, or black discharge. General instructions  Keep your perineum clean and dry as told by your health care provider.  Wear loose, comfortable clothing.  Wipe from front to back when you use the toilet.  Ask your health care provider if you can shower or take a bath. If you had an episiotomy or a perineal tear during labor and delivery, your health care provider may tell you not to take baths for a certain length of time.  Wear a bra that supports your breasts and fits you well.  If possible, have someone help you with household activities and help care for your baby for at least a few days  after you leave the hospital.  Keep all follow-up visits for you and your baby as told by your health care provider. This is important. Contact a health care provider if:  You have: ? Vaginal discharge that has a bad smell. ? Difficulty urinating. ? Pain when urinating. ? A sudden increase or decrease in the frequency of your bowel movements. ? More redness, swelling, or pain around your episiotomy or vaginal tear. ? More fluid or blood coming from your episiotomy or vaginal tear. ? Pus or a bad smell coming from your episiotomy or vaginal tear. ? A fever. ? A rash. ? Little or no interest in activities you used to enjoy. ? Questions about caring for yourself or your baby.  Your episiotomy or vaginal tear feels warm to the touch.  Your episiotomy or vaginal tear is separating or does not appear to be healing.  Your breasts are painful, hard, or turn red.  You feel unusually sad or worried.  You feel nauseous or you vomit.  You pass large blood clots from your vagina. If you pass a blood clot from your vagina, save it to show to your health care provider. Do not flush blood clots down the toilet without having your health care provider look at them.  You urinate more than usual.  You are dizzy or light-headed.  You have not breastfed at all and you have not had a menstrual period for 12 weeks after delivery.  You have stopped breastfeeding and you have not had a menstrual period for 12 weeks after you stopped breastfeeding. Get help right away if:  You have: ? Pain that does not go away or does not get better with medicine. ? Chest pain. ? Difficulty breathing. ? Blurred vision or spots in your vision. ? Thoughts about hurting yourself or your baby.  You develop pain in your abdomen or in one of your legs.  You develop a severe headache.  You faint.  You bleed from your vagina so much that you fill two sanitary pads in one hour. This information is not intended to  replace advice given to you by your health care provider. Make sure you discuss any questions you have with your health care provider. Document Released: 01/27/2000 Document Revised: 07/13/2015 Document Reviewed: 02/13/2015 Elsevier Interactive Patient Education  2017 Elsevier Inc.   Breast Pumping Tips If you are breastfeeding, there may be times when you cannot feed your baby directly. Returning to work or going on a trip are common examples. Pumping allows you to store breast milk and feed it to your baby later. You may not get much milk when you first start to pump. Your breasts should start to make more after a few days. If you pump at the times you usually feed your baby, you may be able to keep making enough milk to feed your baby without also using formula. The more often you pump, the more milk you will produce. When should I pump?  You can begin to pump soon after delivery. However, some experts recommend waiting about 4 weeks before giving your infant a bottle to make sure breastfeeding is going well.  If you plan to return to work, begin pumping a few weeks before. This will help you develop techniques that work best for you. It also lets you build up a supply of breast milk.  When you are with your infant, feed on demand and pump after each feeding.  When you are away from your infant for several hours, pump for about 15 minutes every 2-3 hours. Pump both breasts at the same time if you can.  If your infant has a formula feeding, make sure to pump around the same time.  If you drink any alcohol, wait 2 hours before pumping. How do I prepare to pump? Your let-down reflexis the natural reaction to stimulation that makes your breast milk flow. It is easier to stimulate this reflex when you are relaxed. Find relaxation techniques that work for you. If you have difficulty with your let-down reflex, try these methods:  Smell one of your infant's blankets or an item of  clothing.  Look at a picture or video of your infant.  Sit in a quiet, private space.  Massage the breast you plan to pump.  Place soothing warmth on the breast.  Play relaxing music.  What are some general breast pumping tips?  Wash your hands before you pump.  You do not need to wash your nipples or breasts.  There are three ways to pump. ? You can use your hand to massage and compress your breast. ? You can use a handheld manual pump. ? You can use an electric pump.  Make sure the suction cup (flange) on the breast pump is the right size. Place the flange directly over the nipple. If it is the wrong size or placed the wrong way, it may be painful and cause nipple damage.  If pumping is uncomfortable, apply a small amount of purified or modified lanolin to your nipple and areola.  If you are using an electric pump, adjust the speed and suction power to be more comfortable.  If pumping is painful or if you are not getting very much milk, you may need a different type of pump. A lactation consultant can help you determine what type of pump to use.  Keep a full water bottle near you at all times. Drinking lots of fluid helps you make more milk.  You can store your milk to use later. Pumped breast milk can be stored in a sealable, sterile container or plastic bag. Label all stored breast milk with the date you pumped it. ? Milk can stay out at room temperature for up to 8 hours. ? You can store your milk in the refrigerator for up to 8 days. ? You can store your milk in the freezer for 3 months. Thaw frozen milk using warm water. Do not put it in the microwave.  Do not smoke. Smoking can lower your milk supply and harm your infant. If you need help quitting, ask your health care provider to recommend a program. When should I call my health care provider or a lactation consultant?  You are having trouble pumping.  You are concerned that you are not making enough milk.  You have  nipple pain, soreness, or redness.  You want to use birth control. Birth control pills may lower your milk supply. Talk to your health care provider about your options. This information is not intended to replace advice given to you by your health care provider. Make sure you discuss any questions you have with your health care provider. Document Released: 07/19/2009 Document Revised: 07/13/2015 Document Reviewed: 11/21/2012 Elsevier Interactive Patient Education  2017 [employer].   Breastfeeding Deciding to breastfeed is one of the best choices you can make for you and your baby. A change in hormones during pregnancy causes your breast tissue to grow and increases the number and size of your milk ducts. These hormones also allow proteins, sugars, and fats from your blood supply to make breast milk in your milk-producing glands. Hormones prevent breast milk from being released before your baby is born as well as prompt milk flow after birth. Once breastfeeding has begun, thoughts of your baby, as well as his or her sucking or crying, can stimulate the release of milk from your milk-producing glands. Benefits of breastfeeding For Your Baby  Your first milk (colostrum) helps your baby's digestive system function better.  There are antibodies in your milk that help your baby fight off infections.  Your baby has a lower incidence of asthma, allergies, and sudden infant death syndrome.  The nutrients in breast milk are better for your baby than infant formulas and are designed uniquely for your babys needs.  Breast milk improves your baby's brain development.  Your baby is less likely to develop other conditions, such as childhood obesity, asthma,  or type 2 diabetes mellitus.  For You  Breastfeeding helps to create a very special bond between you and your baby.  Breastfeeding is convenient. Breast milk is always available at the correct temperature and costs nothing.  Breastfeeding  helps to burn calories and helps you lose the weight gained during pregnancy.  Breastfeeding makes your uterus contract to its prepregnancy size faster and slows bleeding (lochia) after you give birth.  Breastfeeding helps to lower your risk of developing type 2 diabetes mellitus, osteoporosis, and breast or ovarian cancer later in life.  Signs that your baby is hungry Early Signs of Hunger  Increased alertness or activity.  Stretching.  Movement of the head from side to side.  Movement of the head and opening of the mouth when the corner of the mouth or cheek is stroked (rooting).  Increased sucking sounds, smacking lips, cooing, sighing, or squeaking.  Hand-to-mouth movements.  Increased sucking of fingers or hands.  Late Signs of Hunger  Fussing.  Intermittent crying.  Extreme Signs of Hunger Signs of extreme hunger will require calming and consoling before your baby will be able to breastfeed successfully. Do not wait for the following signs of extreme hunger to occur before you initiate breastfeeding:  Restlessness.  A loud, strong cry.  Screaming.  Breastfeeding basics Breastfeeding Initiation  Find a comfortable place to sit or lie down, with your neck and back well supported.  Place a pillow or rolled up blanket under your baby to bring him or her to the level of your breast (if you are seated). Nursing pillows are specially designed to help support your arms and your baby while you breastfeed.  Make sure that your baby's abdomen is facing your abdomen.  Gently massage your breast. With your fingertips, massage from your chest wall toward your nipple in a circular motion. This encourages milk flow. You may need to continue this action during the feeding if your milk flows slowly.  Support your breast with 4 fingers underneath and your thumb above your nipple. Make sure your fingers are well away from your nipple and your babys mouth.  Stroke your baby's  lips gently with your finger or nipple.  When your baby's mouth is open wide enough, quickly bring your baby to your breast, placing your entire nipple and as much of the colored area around your nipple (areola) as possible into your baby's mouth. ? More areola should be visible above your baby's upper lip than below the lower lip. ? Your baby's tongue should be between his or her lower gum and your breast.  Ensure that your baby's mouth is correctly positioned around your nipple (latched). Your baby's lips should create a seal on your breast and be turned out (everted).  It is common for your baby to suck about 2-3 minutes in order to start the flow of breast milk.  Latching Teaching your baby how to latch on to your breast properly is very important. An improper latch can cause nipple pain and decreased milk supply for you and poor weight gain in your baby. Also, if your baby is not latched onto your nipple properly, he or she may swallow some air during feeding. This can make your baby fussy. Burping your baby when you switch breasts during the feeding can help to get rid of the air. However, teaching your baby to latch on properly is still the best way to prevent fussiness from swallowing air while breastfeeding. Signs that your baby has successfully latched  on to your nipple:  Silent tugging or silent sucking, without causing you pain.  Swallowing heard between every 3-4 sucks.  Muscle movement above and in front of his or her ears while sucking.  Signs that your baby has not successfully latched on to nipple:  Sucking sounds or smacking sounds from your baby while breastfeeding.  Nipple pain.  If you think your baby has not latched on correctly, slip your finger into the corner of your babys mouth to break the suction and place it between your baby's gums. Attempt breastfeeding initiation again. Signs of Successful Breastfeeding Signs from your baby:  A gradual decrease in the  number of sucks or complete cessation of sucking.  Falling asleep.  Relaxation of his or her body.  Retention of a small amount of milk in his or her mouth.  Letting go of your breast by himself or herself.  Signs from you:  Breasts that have increased in firmness, weight, and size 1-3 hours after feeding.  Breasts that are softer immediately after breastfeeding.  Increased milk volume, as well as a change in milk consistency and color by the fifth day of breastfeeding.  Nipples that are not sore, cracked, or bleeding.  Signs That Your Pecola Leisure is Getting Enough Milk  Wetting at least 1-2 diapers during the first 24 hours after birth.  Wetting at least 5-6 diapers every 24 hours for the first week after birth. The urine should be clear or pale yellow by 5 days after birth.  Wetting 6-8 diapers every 24 hours as your baby continues to grow and develop.  At least 3 stools in a 24-hour period by age 763 days. The stool should be soft and yellow.  At least 3 stools in a 24-hour period by age 76 days. The stool should be seedy and yellow.  No loss of weight greater than 10% of birth weight during the first 49 days of age.  Average weight gain of 4-7 ounces (113-198 g) per week after age 67 days.  Consistent daily weight gain by age 763 days, without weight loss after the age of 2 weeks.  After a feeding, your baby may spit up a small amount. This is common. Breastfeeding frequency and duration Frequent feeding will help you make more milk and can prevent sore nipples and breast engorgement. Breastfeed when you feel the need to reduce the fullness of your breasts or when your baby shows signs of hunger. This is called "breastfeeding on demand." Avoid introducing a pacifier to your baby while you are working to establish breastfeeding (the first 4-6 weeks after your baby is born). After this time you may choose to use a pacifier. Research has shown that pacifier use during the first year of a  baby's life decreases the risk of sudden infant death syndrome (SIDS). Allow your baby to feed on each breast as long as he or she wants. Breastfeed until your baby is finished feeding. When your baby unlatches or falls asleep while feeding from the first breast, offer the second breast. Because newborns are often sleepy in the first few weeks of life, you may need to awaken your baby to get him or her to feed. Breastfeeding times will vary from baby to baby. However, the following rules can serve as a guide to help you ensure that your baby is properly fed:  Newborns (babies 110 weeks of age or younger) may breastfeed every 1-3 hours.  Newborns should not go longer than 3 hours during the  day or 5 hours during the night without breastfeeding.  You should breastfeed your baby a minimum of 8 times in a 24-hour period until you begin to introduce solid foods to your baby at around 66 months of age.  Breast milk pumping Pumping and storing breast milk allows you to ensure that your baby is exclusively fed your breast milk, even at times when you are unable to breastfeed. This is especially important if you are going back to work while you are still breastfeeding or when you are not able to be present during feedings. Your lactation consultant can give you guidelines on how long it is safe to store breast milk. A breast pump is a machine that allows you to pump milk from your breast into a sterile bottle. The pumped breast milk can then be stored in a refrigerator or freezer. Some breast pumps are operated by hand, while others use electricity. Ask your lactation consultant which type will work best for you. Breast pumps can be purchased, but some hospitals and breastfeeding support groups lease breast pumps on a monthly basis. A lactation consultant can teach you how to hand express breast milk, if you prefer not to use a pump. Caring for your breasts while you breastfeed Nipples can become dry, cracked, and  sore while breastfeeding. The following recommendations can help keep your breasts moisturized and healthy:  Avoid using soap on your nipples.  Wear a supportive bra. Although not required, special nursing bras and tank tops are designed to allow access to your breasts for breastfeeding without taking off your entire bra or top. Avoid wearing underwire-style bras or extremely tight bras.  Air dry your nipples for 3-71minutes after each feeding.  Use only cotton bra pads to absorb leaked breast milk. Leaking of breast milk between feedings is normal.  Use lanolin on your nipples after breastfeeding. Lanolin helps to maintain your skin's normal moisture barrier. If you use pure lanolin, you do not need to wash it off before feeding your baby again. Pure lanolin is not toxic to your baby. You may also hand express a few drops of breast milk and gently massage that milk into your nipples and allow the milk to air dry.  In the first few weeks after giving birth, some women experience extremely full breasts (engorgement). Engorgement can make your breasts feel heavy, warm, and tender to the touch. Engorgement peaks within 3-5 days after you give birth. The following recommendations can help ease engorgement:  Completely empty your breasts while breastfeeding or pumping. You may want to start by applying warm, moist heat (in the shower or with warm water-soaked hand towels) just before feeding or pumping. This increases circulation and helps the milk flow. If your baby does not completely empty your breasts while breastfeeding, pump any extra milk after he or she is finished.  Wear a snug bra (nursing or regular) or tank top for 1-2 days to signal your body to slightly decrease milk production.  Apply ice packs to your breasts, unless this is too uncomfortable for you.  Make sure that your baby is latched on and positioned properly while breastfeeding.  If engorgement persists after 48 hours of  following these recommendations, contact your health care provider or a Advertising copywriter. Overall health care recommendations while breastfeeding  Eat healthy foods. Alternate between meals and snacks, eating 3 of each per day. Because what you eat affects your breast milk, some of the foods may make your baby more irritable than  usual. Avoid eating these foods if you are sure that they are negatively affecting your baby.  Drink milk, fruit juice, and water to satisfy your thirst (about 10 glasses a day).  Rest often, relax, and continue to take your prenatal vitamins to prevent fatigue, stress, and anemia.  Continue breast self-awareness checks.  Avoid chewing and smoking tobacco. Chemicals from cigarettes that pass into breast milk and exposure to secondhand smoke may harm your baby.  Avoid alcohol and drug use, including marijuana. Some medicines that may be harmful to your baby can pass through breast milk. It is important to ask your health care provider before taking any medicine, including all over-the-counter and prescription medicine as well as vitamin and herbal supplements. It is possible to become pregnant while breastfeeding. If birth control is desired, ask your health care provider about options that will be safe for your baby. Contact a health care provider if:  You feel like you want to stop breastfeeding or have become frustrated with breastfeeding.  You have painful breasts or nipples.  Your nipples are cracked or bleeding.  Your breasts are red, tender, or warm.  You have a swollen area on either breast.  You have a fever or chills.  You have nausea or vomiting.  You have drainage other than breast milk from your nipples.  Your breasts do not become full before feedings by the fifth day after you give birth.  You feel sad and depressed.  Your baby is too sleepy to eat well.  Your baby is having trouble sleeping.  Your baby is wetting less than 3  diapers in a 24-hour period.  Your baby has less than 3 stools in a 24-hour period.  Your baby's skin or the white part of his or her eyes becomes yellow.  Your baby is not gaining weight by 665 days of age. Get help right away if:  Your baby is overly tired (lethargic) and does not want to wake up and feed.  Your baby develops an unexplained fever. This information is not intended to replace advice given to you by your health care provider. Make sure you discuss any questions you have with your health care provider. Document Released: 01/29/2005 Document Revised: 07/13/2015 Document Reviewed: 07/23/2012 Elsevier Interactive Patient Education  2017 [employer]

## 2017-01-20 NOTE — Progress Notes (Signed)
Patient ID: Cynthia ParisianKatharine S Diaz, female   DOB: 02-Nov-1975, 41 y.o.   MRN: [id number]  Patient is feeling strong contractions.  SVE 3/50/-3, unchanged. Pitocin is 4116mu/min. Will continue with pitocin induction. When 20 mu/min is reached will rupture and internalize.  FHR 135 baseline, moderate variablity, no decelerations, 15x15 accelerations. Toco q 2minutes.  Cynthia Idlerhristanna Lakisa Lotz MD 01/20/17 3:30 PM

## 2017-01-20 NOTE — Progress Notes (Signed)
Patient ID: Cynthia ParisianKatharine S Chirco, female   DOB: [birth year], 41 y.o.   MRN: [id number] Patient slept overnight. Received two doses of cytotec. Patient is going to shower then we will restart pitocin. Patient okay for regular breakfast.  FHR 130 baseline, moderate variability, 15x15 accelerations, no decelerations Toco: every 5 minutes -Christanna Schuman MD 01/20/17 8:35 AM

## 2017-01-20 NOTE — Progress Notes (Signed)
Patient ID: Cynthia ParisianKatharine S Diaz, female   DOB: 1975/10/21, 41 y.o.   MRN: [id number] Patient had large gush of fluid in shower, she says yellow/bloody in color.  On recheck she is 4/90/-1 . No sign of meconium on glove.  She desires an epidural for pain management.  Adelene Idlerhristanna Michaeljames Milnes MD 01/20/17 5:35 PM

## 2017-01-20 NOTE — Discharge Summary (Signed)
OB Discharge Summary     Patient Name: Cynthia Diaz DOB: Jul 22, 1975 MRN: [id number]  Date of admission: 01/18/2017 Delivering MD: Natale Milchhristanna R Schuman, MD  Date of Delivery: 01/18/2017  Date of discharge: 01/22/2017   Admitting diagnosis: 40 wks preg Intrauterine pregnancy: 766w1d     Secondary diagnosis: AMA     Discharge diagnosis: Term Pregnancy Delivered                        Hospital course:  Induction of Labor With Vaginal Delivery   41 y.o. yo G1P1001 at 3366w1d was admitted to the hospital 01/18/2017 for induction of labor.  Indication for induction: AMA.  Patient had an uncomplicated labor course as follows: Membrane Rupture Time/Date: 5:25 PM ,01/20/2017   Intrapartum Procedures: Episiotomy: None [1]                                         Lacerations:  2nd degree [3]  Patient had delivery of a Viable infant.  Information for the patient's newborn:  Carolin CoyWatts, Girl Zo [[id number]][[id number]]   Operative vaginal delivery - Vacuum  01/20/2017  Details of delivery can be found in separate delivery note.  Patient had a routine postpartum course. Patient is discharged home on 01/22/17.                                                                 Post partum procedures: none  Complications: None  Physical exam on 01/22/2017: Vitals:   01/21/17 0722 01/21/17 1711 01/21/17 2300 01/22/17 0721  BP: 127/73 122/83 122/66 121/64  Pulse: 65 70 63 68  Resp: 18 18 18 18   Temp: 98.6 F (37 C) 98.5 F (36.9 C) 98 F (36.7 C) 98.2 F (36.8 C)  TempSrc: Oral Oral Oral Oral  SpO2: 98% 97% 99% 98%  Weight:      Height:       General: alert, cooperative and no distress Lochia: appropriate Uterine Fundus: firm Incision: N/A DVT Evaluation: No evidence of DVT seen on physical exam. No significant calf/ankle edema.  Labs: Lab Results  Component Value Date   WBC 7.3 01/22/2017   HGB 9.3 (L) 01/22/2017   HCT 26.3 (L) 01/22/2017   MCV 92.5 01/22/2017   PLT 190 01/22/2017    CMP Latest Ref Rng & Units 01/18/2017  Glucose 65 - 99 mg/dL 425(Z127(H)  BUN 6 - 20 mg/dL 11  Creatinine 5.630.44 - 8.751.00 mg/dL 6.430.81  Sodium 329135 - 518145 mmol/L 136  Potassium 3.5 - 5.1 mmol/L 3.4(L)  Chloride 101 - 111 mmol/L 105  CO2 22 - 32 mmol/L 21(L)  Calcium 8.9 - 10.3 mg/dL 84.110.1  Total Protein 6.5 - 8.1 g/dL 6.4(L)  Total Bilirubin 0.3 - 1.2 mg/dL 0.3  Alkaline Phos 38 - 126 U/L 136(H)  AST 15 - 41 U/L 21  ALT 14 - 54 U/L 12(L)    Discharge instructions: per After Visit Summary.  Medications:  Allergies as of 01/22/2017      Reactions   Amoxicillin Hives   Penicillins Hives      Medication List    TAKE these medications   busPIRone 10  MG tablet Commonly known as:  BUSPAR TAKE 1 TABLET (10 MG TOTAL) BY MOUTH 2 (TWO) TIMES DAILY.   ferrous sulfate 325 (65 FE) MG EC tablet Take 1 tablet (325 mg total) by mouth 2 (two) times daily.   sertraline 100 MG tablet Commonly known as:  ZOLOFT Take 1 tablet (100 mg total) by mouth daily.   traZODone 50 MG tablet Commonly known as:  DESYREL TAKE 0.5-1 TABLETS (25-50 MG TOTAL) BY MOUTH AT BEDTIME AS NEEDED FOR SLEEP.       Diet: routine diet  Activity: Advance as tolerated. Pelvic rest for 6 weeks.   Outpatient follow up: Follow-up Information    [facility]. Schedule an appointment as soon as possible for a visit in 4 week(s).   Specialty:  Obstetrics and Gynecology Contact information: [address] [place] [phone]            Postpartum contraception: Combination OCPs Rhogam Given postpartum: no Rubella vaccine given postpartum: no Varicella vaccine given postpartum: no TDaP given antepartum or postpartum: Yes  Newborn Data: Live born female  Birth Weight: 6 lb 15 oz (3147 g) APGAR: 8, 9  Newborn Delivery   Birth date/time:  01/20/2017 18:13:00 Delivery type:  Vaginal, Vacuum (Extractor)      Baby Feeding: Breast  Disposition: home with  mother  SIGNED:  Oswaldo ConroyJacelyn Y Kylia Grajales, CNM 01/22/2017 10:09 AM

## 2017-01-21 LAB — CBC
HCT: 27.4 % — ABNORMAL LOW (ref 35.0–47.0)
Hemoglobin: 9.7 g/dL — ABNORMAL LOW (ref 12.0–16.0)
MCH: 32.6 pg (ref 26.0–34.0)
MCHC: 35.3 g/dL (ref 32.0–36.0)
MCV: 92.4 fL (ref 80.0–100.0)
PLATELETS: 194 10*3/uL (ref 150–440)
RBC: 2.97 MIL/uL — ABNORMAL LOW (ref 3.80–5.20)
RDW: 13.4 % (ref 11.5–14.5)
WBC: 12.2 10*3/uL — AB (ref 3.6–11.0)

## 2017-01-21 MED ORDER — ACETAMINOPHEN 325 MG PO TABS
650.0000 mg | ORAL_TABLET | ORAL | Status: DC
  Administered 2017-01-21 – 2017-01-22 (×8): 650 mg via ORAL
  Filled 2017-01-21 (×8): qty 2

## 2017-01-21 MED ORDER — FERROUS SULFATE 325 (65 FE) MG PO TABS
325.0000 mg | ORAL_TABLET | Freq: Two times a day (BID) | ORAL | Status: DC
  Filled 2017-01-21: qty 1

## 2017-01-21 MED ORDER — MAGNESIUM HYDROXIDE 400 MG/5ML PO SUSP: 30.0000 mL | ORAL | Status: DC | PRN

## 2017-01-21 MED ORDER — DIBUCAINE 1 % RE OINT: 1.0000 "application " | TOPICAL_OINTMENT | RECTAL | Status: DC | PRN

## 2017-01-21 MED ORDER — BENZOCAINE-MENTHOL 20-0.5 % EX AERO
1.0000 "application " | INHALATION_SPRAY | CUTANEOUS | Status: DC | PRN
  Administered 2017-01-21: 1 via TOPICAL
  Filled 2017-01-21: qty 56

## 2017-01-21 MED ORDER — WITCH HAZEL-GLYCERIN EX PADS: 1.0000 "application " | MEDICATED_PAD | CUTANEOUS | Status: DC | PRN

## 2017-01-21 MED ORDER — DOCUSATE SODIUM 100 MG PO CAPS
100.0000 mg | ORAL_CAPSULE | Freq: Two times a day (BID) | ORAL | Status: DC
  Filled 2017-01-21 (×2): qty 1

## 2017-01-21 MED ORDER — ONDANSETRON HCL 4 MG PO TABS: 4.0000 mg | ORAL_TABLET | ORAL | Status: DC | PRN

## 2017-01-21 MED ORDER — PRENATAL MULTIVITAMIN CH
1.0000 | ORAL_TABLET | Freq: Every day | ORAL | Status: DC
  Administered 2017-01-21: 1 via ORAL
  Filled 2017-01-21: qty 1

## 2017-01-21 MED ORDER — IBUPROFEN 600 MG PO TABS
600.0000 mg | ORAL_TABLET | Freq: Four times a day (QID) | ORAL | Status: DC
  Filled 2017-01-21: qty 1

## 2017-01-21 MED ORDER — SIMETHICONE 80 MG PO CHEW: 80.0000 mg | CHEWABLE_TABLET | ORAL | Status: DC | PRN

## 2017-01-21 MED ORDER — SERTRALINE HCL 100 MG PO TABS
100.0000 mg | ORAL_TABLET | Freq: Every day | ORAL | Status: DC
  Administered 2017-01-21: 100 mg via ORAL
  Filled 2017-01-21 (×2): qty 1

## 2017-01-21 MED ORDER — ZOLPIDEM TARTRATE 5 MG PO TABS: 5.0000 mg | ORAL_TABLET | Freq: Every evening | ORAL | Status: DC | PRN

## 2017-01-21 MED ORDER — TETANUS-DIPHTH-ACELL PERTUSSIS 5-2.5-18.5 LF-MCG/0.5 IM SUSP: 0.5000 mL | Freq: Once | INTRAMUSCULAR | Status: DC

## 2017-01-21 MED ORDER — MEASLES, MUMPS & RUBELLA VAC ~~LOC~~ INJ
0.5000 mL | INJECTION | Freq: Once | SUBCUTANEOUS | Status: DC
  Filled 2017-01-21: qty 0.5

## 2017-01-21 MED ORDER — ONDANSETRON HCL 4 MG/2ML IJ SOLN: 4.0000 mg | INTRAMUSCULAR | Status: DC | PRN

## 2017-01-21 MED ORDER — TRAZODONE 25 MG HALF TABLET
25.0000 mg | ORAL_TABLET | Freq: Every evening | ORAL | Status: DC | PRN
  Filled 2017-01-21: qty 2

## 2017-01-21 MED ORDER — BUSPIRONE HCL 5 MG PO TABS
10.0000 mg | ORAL_TABLET | Freq: Two times a day (BID) | ORAL | Status: DC | PRN
  Administered 2017-01-21: 10 mg via ORAL
  Filled 2017-01-21 (×2): qty 1

## 2017-01-21 MED ORDER — COCONUT OIL OIL
1.0000 "application " | TOPICAL_OIL | Status: DC | PRN
  Administered 2017-01-21: 1 via TOPICAL
  Filled 2017-01-21: qty 120

## 2017-01-21 NOTE — Anesthesia Postprocedure Evaluation (Signed)
Anesthesia Post Note  Patient: Cynthia ParisianKatharine S Diaz  Procedure(s) Performed: AN AD HOC LABOR EPIDURAL  Patient location during evaluation: [facility] Anesthesia Type: Epidural Level of consciousness: awake and alert and oriented Pain management: pain level controlled Vital Signs Assessment: post-procedure vital signs reviewed and stable Respiratory status: spontaneous breathing and nonlabored ventilation Cardiovascular status: stable Postop Assessment: no apparent nausea or vomiting and adequate PO intake Anesthetic complications: no     Last Vitals:  Vitals:   01/21/17 0335 01/21/17 0722  BP: 129/71 127/73  Pulse: 65 65  Resp: 18 18  Temp: 36.9 C 37 C  SpO2: 99% 98%    Last Pain:  Vitals:   01/21/17 0722  TempSrc: Oral  PainSc:                  Zachary GeorgeWeatherly,  Deryk Bozman F

## 2017-01-21 NOTE — Progress Notes (Signed)
Admit Date: 01/18/2017 Today's Date: 01/21/2017  Post Partum Day 1  Subjective:  No complaints, up ad lib, voiding, tolerating PO and + flatus.  Vaginal packing was removed at midnight. Patient has small bleeding that was stable an hour later.   Objective: Temp:  [98 F (36.7 C)-98.6 F (37 C)] 98.6 F (37 C) (12/10 0722) Pulse Rate:  [64-132] 65 (12/10 0722) Resp:  [16-18] 18 (12/10 0722) BP: (117-168)/(69-90) 127/73 (12/10 0722) SpO2:  [98 %-99 %] 98 % (12/10 78290722)  Physical Exam:  General: alert, cooperative and no distress Lochia: appropriate Uterine Fundus: firm Incision: none DVT Evaluation: No evidence of DVT seen on physical exam. No significant calf/ankle edema. No significant swelling of the perineum. No bruising or evidence of hematoma.   Recent Labs    01/18/17 0929 01/21/17 0518  HGB 12.3 9.7*  HCT 35.0 27.4*    Assessment/Plan: Plan for discharge tomorrow, Breastfeeding and Infant doing well  Rh+ Rubella and varicella Immune Motrin and tylenol for pain Encouraged increased ambulation Tolerating regular diet. Anemia- will recheck CBC tomorrow. Patient will continue oral iron therapy at home. Anxiety and depression- continue home medications   LOS: 3 days   Christanna R [employer] [facility] 01/21/2017, 8:21 AM

## 2017-01-21 NOTE — Lactation Note (Signed)
This note was copied from a baby's chart. Lactation Consultation Note  Patient Name: Cynthia Diaz ZOXWR'UToday's Date: 01/21/2017   Observed mom latching [name] to breast with minimal lactation assistance.  She seems to do better in cross cradle hold position lying on nursing pillow.  Mom becoming more independent with latching.  Jerold CoombeZadie has tight frenulum which was communicated to Dr. Princess BruinsBoylston and pointed out to parents.  Information given on clipping and lazer options.  Mom's nipples are tender.  Demonstrated hand expression and could easily express lots of colostrum.  Coconut oil given and instructed in use.  Jerold CoombeZadie has been fussy and passing gas and seemed to calm down after having bowel movement.  Reviewed normal course of lactation and routine newborn feeding patterns.  Parents asking about dietary restrictions and discussed moderation and how to know if true food allergy.  Discussed drinking plenty of fluids to replenish what Jerold CoombeZadie would be taking out.    Lactation name and number written on white board and encouraged to call for questions, concerns or assistance.      Maternal Data    Feeding Feeding Type: Breast Fed Length of feed: 15 min  LATCH Score                   Interventions    Lactation Tools Discussed/Used     Consult Status      [name], [name] 01/21/2017, 8:58 PM

## 2017-01-22 LAB — CBC
HEMATOCRIT: 26.3 % — AB (ref 35.0–47.0)
Hemoglobin: 9.3 g/dL — ABNORMAL LOW (ref 12.0–16.0)
MCH: 32.6 pg (ref 26.0–34.0)
MCHC: 35.3 g/dL (ref 32.0–36.0)
MCV: 92.5 fL (ref 80.0–100.0)
Platelets: 190 10*3/uL (ref 150–440)
RBC: 2.85 MIL/uL — AB (ref 3.80–5.20)
RDW: 14 % (ref 11.5–14.5)
WBC: 7.3 10*3/uL (ref 3.6–11.0)

## 2017-01-22 MED ORDER — FERROUS SULFATE 325 (65 FE) MG PO TBEC: 325.0000 mg | DELAYED_RELEASE_TABLET | Freq: Two times a day (BID) | ORAL | 11 refills | Status: DC

## 2017-01-22 NOTE — Progress Notes (Signed)
PPD#2 OVD (Vacuum) Subjective:  Resting in bed, appears fatigued. Pain control is adequate with PRN medication. Voiding without difficulty. Tolerating a regular diet. Ambulating well.  Objective:   Blood pressure 121/64, pulse 68, temperature 98.2 F (36.8 C), temperature source Oral, resp. rate 18, height 5\' 9"  (1.753 m), weight 214 lb (97.1 kg), last menstrual period 05/16/2016, SpO2 98%, currently breastfeeding.  General: NAD Pulmonary: no increased work of breathing Abdomen: non-distended, non-tender Uterus:  fundus firm; lochia rubra small Incision: None Extremities: no edema, no erythema, no tenderness, no signs of DVT  Results for orders placed or performed during the hospital encounter of 01/18/17 (from the past 72 hour(s))  CBC     Status: Abnormal   Collection Time: 01/21/17  5:18 AM  Result Value Ref Range   WBC 12.2 (H) 3.6 - 11.0 K/uL   RBC 2.97 (L) 3.80 - 5.20 MIL/uL   Hemoglobin 9.7 (L) 12.0 - 16.0 g/dL   HCT 14.727.4 (L) 82.935.0 - 56.247.0 %   MCV 92.4 80.0 - 100.0 fL   MCH 32.6 26.0 - 34.0 pg   MCHC 35.3 32.0 - 36.0 g/dL   RDW 13.013.4 86.511.5 - 78.414.5 %   Platelets 194 150 - 440 K/uL  CBC     Status: Abnormal   Collection Time: 01/22/17  4:06 AM  Result Value Ref Range   WBC 7.3 3.6 - 11.0 K/uL   RBC 2.85 (L) 3.80 - 5.20 MIL/uL   Hemoglobin 9.3 (L) 12.0 - 16.0 g/dL   HCT 69.626.3 (L) 29.535.0 - 28.447.0 %   MCV 92.5 80.0 - 100.0 fL   MCH 32.6 26.0 - 34.0 pg   MCHC 35.3 32.0 - 36.0 g/dL   RDW 13.214.0 44.011.5 - 10.214.5 %   Platelets 190 150 - 440 K/uL    Assessment:   41 y.o. G1P1001 postpartum day # 2 in stable condition.  Plan:   1) Acute blood loss anemia - hemodynamically stable and asymptomatic - PO ferrous sulfate, to continue on discharge  2) Blood Type --/--/O POS (12/07 [id number]) /   3) Rubella 1.90 (05/21 1523) / Varicella immune /TDAP status: declines  4) Breast feeding  5) Contraception: plans OCP  6) Disposition: discharge home today with newborn, or discharge and board  with baby as needed.  Cynthia BruinsJacelyn Oliviana Diaz, CNM 01/22/2017  10:04 AM

## 2017-01-22 NOTE — Progress Notes (Signed)
Discharge instructions given. Patient verbalizes understanding of teaching. Patient discharged home at 1400. 

## 2017-01-22 NOTE — Plan of Care (Signed)
Patient's VSS; fundus firm; small amount lochia. Good maternal-child bonding observed; breastfeeding with good technique observed. Good appetite; good po fluid intake; voiding; stooled after delivery. Husband at bedside and supportive. Plans for discharge today.

## 2017-01-31 ENCOUNTER — Other Ambulatory Visit: Payer: Self-pay | Admitting: Obstetrics & Gynecology

## 2017-02-16 ENCOUNTER — Encounter: Payer: Self-pay | Admitting: Obstetrics and Gynecology

## 2017-02-20 ENCOUNTER — Ambulatory Visit (INDEPENDENT_AMBULATORY_CARE_PROVIDER_SITE_OTHER): Payer: BLUE CROSS/BLUE SHIELD | Admitting: Obstetrics and Gynecology

## 2017-02-20 ENCOUNTER — Encounter: Payer: Self-pay | Admitting: Obstetrics and Gynecology

## 2017-02-20 MED ORDER — NORETHIN-ETH ESTRAD-FE BIPHAS 1 MG-10 MCG / 10 MCG PO TABS: 1.0000 | ORAL_TABLET | Freq: Every day | ORAL | 3 refills | Status: DC

## 2017-02-20 NOTE — Progress Notes (Signed)
  OBSTETRICS POSTPARTUM CLINIC PROGRESS NOTE  Subjective:     Cynthia Diaz is a 42 y.o. 351P1001 female who presents for a postpartum visit. She is 4 weeks postpartum following a Term pregnancy and delivery by Vacuum assisted vaginal delivery.  I have fully reviewed the prenatal and intrapartum course. Anesthesia: epidural.  Postpartum course has been complicated by uncomplicated.  Baby is feeding by Breast.  Bleeding: patient has not  resumed menses.  Bowel function is normal. Bladder function is normal.  Patient is not sexually active. Contraception method desired is OCP (estrogen/progesterone).  Postpartum depression screening: negative. [place] 13.  The following portions of the patient's history were reviewed and updated as appropriate: allergies, current medications, past family history, past medical history, past social history, past surgical history and problem list.  Review of Systems Pertinent items are noted in HPI.  Objective:    BP 118/76   Pulse 79   Ht 5\' 9"  (1.753 m)   Wt 182 lb (82.6 kg)   Breastfeeding? Yes   BMI 26.88 kg/m   General:  alert and no distress   Breasts:  inspection negative, no nipple discharge or bleeding, no masses or nodularity palpable  Lungs: clear to auscultation bilaterally  Heart:  regular rate and rhythm, S1, S2 normal, no murmur, click, rub or gallop  Abdomen: soft, non-tender; bowel sounds normal; no masses,  no organomegaly.     Vulva:  normal  Vagina: normal vagina, no discharge, exudate, lesion, or erythema. Perineum is healing well from repair of laceration.   Cervix:  no cervical motion tenderness and no lesions  Corpus: normal size, contour, position, consistency, mobility, non-tender  Adnexa:  normal adnexa and no mass, fullness, tenderness  Rectal Exam: Not performed.          Assessment:  Post Partum Care visit 1. Postpartum care and examination    Plan:  See orders and Patient Instructions Contraceptive  counseling for oral contraceptives (estrogen/progesterone) Follow up in:  as needed.   Adelene Idlerhristanna Schuman, MD [facility], [facility] 02/20/2017  1:43 PM

## 2017-05-03 ENCOUNTER — Other Ambulatory Visit: Payer: Self-pay | Admitting: Obstetrics & Gynecology

## 2017-05-06 ENCOUNTER — Ambulatory Visit (INDEPENDENT_AMBULATORY_CARE_PROVIDER_SITE_OTHER): Payer: BLUE CROSS/BLUE SHIELD | Admitting: Primary Care

## 2017-05-06 ENCOUNTER — Encounter: Payer: Self-pay | Admitting: Primary Care

## 2017-05-06 ENCOUNTER — Ambulatory Visit (INDEPENDENT_AMBULATORY_CARE_PROVIDER_SITE_OTHER)
Admission: RE | Admit: 2017-05-06 | Discharge: 2017-05-06 | Disposition: A | Discharge status: Home / Self Care | Payer: BLUE CROSS/BLUE SHIELD | Source: Ambulatory Visit | Attending: Primary Care | Admitting: Primary Care

## 2017-05-06 VITALS — BP 102/64 | HR 80 | Temp 98.2°F | Ht 69.0 in | Wt 181.5 lb

## 2017-05-06 DIAGNOSIS — M542 Cervicalgia: Secondary | ICD-10-CM | POA: Diagnosis not present

## 2017-05-06 DIAGNOSIS — G8929 Other chronic pain: Secondary | ICD-10-CM

## 2017-05-06 NOTE — Patient Instructions (Signed)
Complete xray(s) prior to leaving today. I will notify you of your results once received.  Try the neck stretching exercises below.  Continue massages.  It was a pleasure to see you today!   Neck Exercises Neck exercises can be important for many reasons:  They can help you to improve and maintain flexibility in your neck. This can be especially important as you age.  They can help to make your neck stronger. This can make movement easier.  They can reduce or prevent neck pain.  They may help your upper back.  Ask your health care provider which neck exercises would be best for you. Exercises Neck Press Repeat this exercise 10 times. Do it first thing in the morning and right before bed or as told by your health care provider. 1. Lie on your back on a firm bed or on the floor with a pillow under your head. 2. Use your neck muscles to push your head down on the pillow and straighten your spine. 3. Hold the position as well as you can. Keep your head facing up and your chin tucked. 4. Slowly count to 5 while holding this position. 5. Relax for a few seconds. Then repeat.  Isometric Strengthening Do a full set of these exercises 2 times a day or as told by your health care provider. 1. Sit in a supportive chair and place your hand on your forehead. 2. Push forward with your head and neck while pushing back with your hand. Hold for 10 seconds. 3. Relax. Then repeat the exercise 3 times. 4. Next, do thesequence again, this time putting your hand against the back of your head. Use your head and neck to push backward against the hand pressure. 5. Finally, do the same exercise on either side of your head, pushing sideways against the pressure of your hand.  Prone Head Lifts Repeat this exercise 5 times. Do this 2 times a day or as told by your health care provider. 1. Lie face-down, resting on your elbows so that your chest and upper back are raised. 2. Start with your head facing  downward, near your chest. Position your chin either on or near your chest. 3. Slowly lift your head upward. Lift until you are looking straight ahead. Then continue lifting your head as far back as you can stretch. 4. Hold your head up for 5 seconds. Then slowly lower it to your starting position.  Supine Head Lifts Repeat this exercise 8-10 times. Do this 2 times a day or as told by your health care provider. 1. Lie on your back, bending your knees to point to the ceiling and keeping your feet flat on the floor. 2. Lift your head slowly off the floor, raising your chin toward your chest. 3. Hold for 5 seconds. 4. Relax and repeat.  Scapular Retraction Repeat this exercise 5 times. Do this 2 times a day or as told by your health care provider. 1. Stand with your arms at your sides. Look straight ahead. 2. Slowly pull both shoulders backward and downward until you feel a stretch between your shoulder blades in your upper back. 3. Hold for 10-30 seconds. 4. Relax and repeat.  Contact a health care provider if:  Your neck pain or discomfort gets much worse when you do an exercise.  Your neck pain or discomfort does not improve within 2 hours after you exercise. If you have any of these problems, stop exercising right away. Do not do the exercises again  unless your health care provider says that you can. Get help right away if:  You develop sudden, severe neck pain. If this happens, stop exercising right away. Do not do the exercises again unless your health care provider says that you can. Exercises Neck Stretch  Repeat this exercise 3-5 times. 1. Do this exercise while standing or while sitting in a chair. 2. Place your feet flat on the floor, shoulder-width apart. 3. Slowly turn your head to the right. Turn it all the way to the right so you can look over your right shoulder. Do not tilt or tip your head. 4. Hold this position for 10-30 seconds. 5. Slowly turn your head to the  left, to look over your left shoulder. 6. Hold this position for 10-30 seconds.  Neck Retraction Repeat this exercise 8-10 times. Do this 3-4 times a day or as told by your health care provider. 1. Do this exercise while standing or while sitting in a sturdy chair. 2. Look straight ahead. Do not bend your neck. 3. Use your fingers to push your chin backward. Do not bend your neck for this movement. Continue to face straight ahead. If you are doing the exercise properly, you will feel a slight sensation in your throat and a stretch at the back of your neck. 4. Hold the stretch for 1-2 seconds. Relax and repeat.  This information is not intended to replace advice given to you by your health care provider. Make sure you discuss any questions you have with your health care provider. Document Released: 01/10/2015 Document Revised: 07/07/2015 Document Reviewed: 08/09/2014 Elsevier Interactive Patient Education  [employer]2018 Else[employer] Inc.

## 2017-05-06 NOTE — Assessment & Plan Note (Signed)
Present for the past 1 year, progressing.  Check plain films today. Offered physical therapy but she kindly declines. She'd like to continue with massage as she had one session and found some improvement. This is reasonable.   Consider PT vs ortho referral in the future. Home neck exercises provided.

## 2017-05-06 NOTE — Progress Notes (Signed)
Subjective:    Patient ID: Cynthia Diaz, female    DOB: Apr 23, 1975, 42 y.o.   MRN: [id number]  HPI  Cynthia Diaz is a 42 year old female who presents today with a chief complaint of neck pain. Her pain is located to the left posterior and lateral neck which has been present for the past one year. Over time she's noticed a progression of her pain. She started to notice popping to the neck, so she had a massage last weekend. She did notice some improvement after the massage but continues to notice discomfort.   She denies numbness/tingling, weakness, changes in skin color, injury/trauma.   Review of Systems  Musculoskeletal: Positive for neck pain.  Skin: Negative for color change.  Neurological: Negative for weakness and numbness.       Past Medical History:  Diagnosis Date  . Alcohol abuse   . Chicken pox   . Elevated blood pressure   . Generalized anxiety disorder   . Kidney stones      Social History   Socioeconomic History  . Marital status: Single    Spouse name: Not on file  . Number of children: Not on file  . Years of education: Not on file  . Highest education level: Not on file  Occupational History  . Not on file  Social Needs  . Financial resource strain: Not on file  . Food insecurity:    Worry: Not on file    Inability: Not on file  . Transportation needs:    Medical: Not on file    Non-medical: Not on file  Tobacco Use  . Smoking status: Never Smoker  . Smokeless tobacco: Never Used  Substance and Sexual Activity  . Alcohol use: No    Alcohol/week: 0.0 oz  . Drug use: No  . Sexual activity: Yes    Birth control/protection: None  Lifestyle  . Physical activity:    Days per week: Not on file    Minutes per session: Not on file  . Stress: Not on file  Relationships  . Social connections:    Talks on phone: Not on file    Gets together: Not on file    Attends religious service: Not on file    Active member of club or organization: Not on  file    Attends meetings of clubs or organizations: Not on file    Relationship status: Not on file  . Intimate partner violence:    Fear of current or ex partner: Not on file    Emotionally abused: Not on file    Physically abused: Not on file    Forced sexual activity: Not on file  Other Topics Concern  . 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   Enjoys exercising, running, playing soccer, art, gardening.    Past Surgical History:  Procedure Laterality Date  . COLPOSCOPY  2005  . KNEE SURGERY Left   . WISDOM TOOTH EXTRACTION      Family History  Problem Relation Age of Onset  . Arthritis Father   . Hyperlipidemia Father   . Hypertension Father   . Breast cancer Maternal Aunt     Allergies  Allergen Reactions  . Amoxicillin Hives  . Penicillins Hives    Current Outpatient Medications on File Prior to Visit  Medication Sig Dispense Refill  . busPIRone (BUSPAR) 10 MG tablet TAKE 1 TABLET (  10 MG TOTAL) BY MOUTH 2 (TWO) TIMES DAILY. 180 tablet 1  . Norethindrone-Ethinyl Estradiol-Fe Biphas (LO LOESTRIN FE) 1 MG-10 MCG / 10 MCG tablet Take 1 tablet by mouth daily. 84 tablet 3  . sertraline (ZOLOFT) 100 MG tablet TAKE 1 TABLET (100 MG TOTAL) BY MOUTH DAILY 30 tablet 0  . traZODone (DESYREL) 50 MG tablet TAKE 0.5-1 TABLETS (25-50 MG TOTAL) BY MOUTH AT BEDTIME AS NEEDED FOR SLEEP. (Patient not taking: Reported on 02/20/2017) 90 tablet 0   No current facility-administered medications on file prior to visit.     BP 102/64   Pulse 80   Temp 98.2 F (36.8 C) (Oral)   Ht 5\' 9"  (1.753 m)   Wt 181 lb 8 oz (82.3 kg)   LMP  (LMP Unknown)   SpO2 97%   Breastfeeding? Yes Comment: 04/2016  BMI 26.80 kg/m     Objective:   Physical Exam  Constitutional: She appears well-nourished.  Neck: Neck supple. No spinous process tenderness present. No neck rigidity. Decreased range of motion present.  MSK stiffness to  left posterior and lateral neckupper back. Slight decrease in ROM with right and left rotation.  Cardiovascular: Normal rate.  Pulmonary/Chest: Effort normal.          Assessment & Plan:

## 2017-05-08 ENCOUNTER — Other Ambulatory Visit: Payer: Self-pay | Admitting: Primary Care

## 2017-05-08 DIAGNOSIS — Z0000 Encounter for general adult medical examination without abnormal findings: Secondary | ICD-10-CM

## 2017-05-14 ENCOUNTER — Other Ambulatory Visit: Payer: BLUE CROSS/BLUE SHIELD

## 2017-05-17 ENCOUNTER — Encounter: Payer: BLUE CROSS/BLUE SHIELD | Admitting: Primary Care

## 2017-05-25 ENCOUNTER — Other Ambulatory Visit: Payer: Self-pay | Admitting: Primary Care

## 2017-05-25 DIAGNOSIS — G4700 Insomnia, unspecified: Secondary | ICD-10-CM

## 2017-05-27 NOTE — Telephone Encounter (Signed)
Electronic refill request Last office visit 05/06/17 Last refill 12/11/16 #90

## 2017-05-27 NOTE — Telephone Encounter (Signed)
Refill sent to pharmacy.   

## 2017-06-03 ENCOUNTER — Other Ambulatory Visit: Payer: Self-pay | Admitting: Obstetrics & Gynecology

## 2017-06-03 ENCOUNTER — Other Ambulatory Visit: Payer: Self-pay

## 2017-06-03 NOTE — Telephone Encounter (Signed)
Please advise 

## 2017-06-04 ENCOUNTER — Other Ambulatory Visit (INDEPENDENT_AMBULATORY_CARE_PROVIDER_SITE_OTHER): Payer: BLUE CROSS/BLUE SHIELD

## 2017-06-04 DIAGNOSIS — Z0000 Encounter for general adult medical examination without abnormal findings: Secondary | ICD-10-CM

## 2017-06-04 LAB — LIPID PANEL
CHOL/HDL RATIO: 2
CHOLESTEROL: 163 mg/dL (ref 0–200)
HDL: 71.5 mg/dL (ref >39.00)
LDL Cholesterol: 77 mg/dL (ref 0–99)
NonHDL: 91.48
TRIGLYCERIDES: 70 mg/dL (ref 0.0–149.0)
VLDL: 14 mg/dL (ref 0.0–40.0)

## 2017-06-04 LAB — COMPREHENSIVE METABOLIC PANEL
ALK PHOS: 96 U/L (ref 39–117)
ALT: 10 U/L (ref 0–35)
AST: 13 U/L (ref 0–37)
Albumin: 4.3 g/dL (ref 3.5–5.2)
BILIRUBIN TOTAL: 0.3 mg/dL (ref 0.2–1.2)
BUN: 18 mg/dL (ref 6–23)
CALCIUM: 9.4 mg/dL (ref 8.4–10.5)
CO2: 30 mEq/L (ref 19–32)
Chloride: 103 mEq/L (ref 96–112)
Creatinine, Ser: 1.05 mg/dL (ref 0.40–1.20)
GFR: 61.18 mL/min (ref >60.00)
Glucose, Bld: 84 mg/dL (ref 70–99)
Potassium: 4.4 mEq/L (ref 3.5–5.1)
Sodium: 138 mEq/L (ref 135–145)
TOTAL PROTEIN: 7.2 g/dL (ref 6.0–8.3)

## 2017-06-04 LAB — HEMOGLOBIN A1C: Hgb A1c MFr Bld: 5.5 % (ref 4.6–6.5)

## 2017-06-06 ENCOUNTER — Other Ambulatory Visit: Payer: Self-pay | Admitting: Primary Care

## 2017-06-06 NOTE — Telephone Encounter (Signed)
Copied from CRM [id number]. Topic: Quick Communication - Rx Refill/Question >> Jun 06, 2017  1:26 PM Eston Mouldavis, Mahasin Riviere B wrote: Medication: sertraline (ZOLOFT) 100 MG tablet   Has the patient contacted their pharmacy? Yes   [id number]- [place], [place] - [address] [phone] (Phone) [phone] (Fax)     Agent: Please be advised that RX refills may take up to 3 business days. We ask that you follow-up with your pharmacy.

## 2017-06-07 ENCOUNTER — Other Ambulatory Visit: Payer: BLUE CROSS/BLUE SHIELD

## 2017-06-07 NOTE — Telephone Encounter (Signed)
Needs appt, also needs PAP

## 2017-06-07 NOTE — Telephone Encounter (Signed)
lmtrc

## 2017-06-07 NOTE — Telephone Encounter (Signed)
sertraline refill Last OV: 11/06/16  Has upcoming appt 06/12/17 Last Refill:05/03/17 #30 tablets  [facility] [address]  PCP: Mayra ReelKate Clark NP

## 2017-06-10 MED ORDER — SERTRALINE HCL 100 MG PO TABS: 100.0000 mg | ORAL_TABLET | Freq: Every day | ORAL | 0 refills | Status: DC

## 2017-06-11 ENCOUNTER — Encounter: Payer: Self-pay | Admitting: Primary Care

## 2017-06-12 ENCOUNTER — Encounter: Payer: BLUE CROSS/BLUE SHIELD | Admitting: Primary Care

## 2017-06-13 ENCOUNTER — Other Ambulatory Visit: Payer: Self-pay | Admitting: Obstetrics & Gynecology

## 2017-06-13 ENCOUNTER — Encounter: Payer: Self-pay | Admitting: Primary Care

## 2017-06-13 ENCOUNTER — Encounter: Payer: Self-pay | Admitting: Obstetrics & Gynecology

## 2017-06-13 NOTE — Telephone Encounter (Signed)
Schedule Annual for PAP with one of [place] (pt choice, or me).  Will make sure she has Zoloft refills until then. Thx.

## 2017-06-29 ENCOUNTER — Other Ambulatory Visit: Payer: Self-pay | Admitting: Primary Care

## 2017-06-29 DIAGNOSIS — F411 Generalized anxiety disorder: Secondary | ICD-10-CM

## 2017-07-04 ENCOUNTER — Encounter: Payer: Self-pay | Admitting: Obstetrics and Gynecology

## 2017-07-04 ENCOUNTER — Ambulatory Visit: Payer: BLUE CROSS/BLUE SHIELD | Admitting: Primary Care

## 2017-07-04 ENCOUNTER — Ambulatory Visit (INDEPENDENT_AMBULATORY_CARE_PROVIDER_SITE_OTHER): Payer: BLUE CROSS/BLUE SHIELD | Admitting: Obstetrics and Gynecology

## 2017-07-04 VITALS — BP 110/70 | HR 57 | Ht 69.0 in | Wt 179.0 lb

## 2017-07-04 DIAGNOSIS — Z0000 Encounter for general adult medical examination without abnormal findings: Secondary | ICD-10-CM | POA: Diagnosis not present

## 2017-07-04 DIAGNOSIS — Z01419 Encounter for gynecological examination (general) (routine) without abnormal findings: Secondary | ICD-10-CM | POA: Diagnosis not present

## 2017-07-04 DIAGNOSIS — Z1231 Encounter for screening mammogram for malignant neoplasm of breast: Secondary | ICD-10-CM

## 2017-07-04 DIAGNOSIS — F411 Generalized anxiety disorder: Secondary | ICD-10-CM | POA: Diagnosis not present

## 2017-07-04 DIAGNOSIS — Z1239 Encounter for other screening for malignant neoplasm of breast: Secondary | ICD-10-CM

## 2017-07-04 MED ORDER — SERTRALINE HCL 100 MG PO TABS: 100.0000 mg | ORAL_TABLET | Freq: Every day | ORAL | 11 refills | Status: DC

## 2017-07-04 NOTE — Progress Notes (Signed)
Gynecology Annual Exam  PCP: Doreene Nest, NP  Chief Complaint:  Chief Complaint  Patient presents with  . Gynecologic Exam    History of Present Illness: Patient is a 42 y.o. G1P1001 presents for annual exam. The patient has no complaints today.   LMP: No LMP recorded. (Menstrual status: Lactating). Average Interval: regular, 28 days Duration of flow: 5 days Heavy Menses: no Clots: no Intermenstrual Bleeding: no Postcoital Bleeding: no Dysmenorrhea: no   The patient is sexually active. She currently uses OCP (estrogen/progesterone) for contraception. She denies dyspareunia.  The patient does perform self breast exams.  There is no notable family history of breast or ovarian cancer in her family.  The patient wears seatbelts: yes.   The patient has regular exercise: yes.    The patient reports current symptoms of depression.   She is taking an antidepressant which is helpful.   Review of Systems: ROS  Past Medical History:  Past Medical History:  Diagnosis Date  . Alcohol abuse   . Chicken pox   . Elevated blood pressure   . Generalized anxiety disorder   . Kidney stones     Past Surgical History:  Past Surgical History:  Procedure Laterality Date  . COLPOSCOPY  2005  . KNEE SURGERY Left   . WISDOM TOOTH EXTRACTION      Gynecologic History:  No LMP recorded. (Menstrual status: Lactating). Contraception: OCP (estrogen/progesterone) Last Pap: Results were: 2017 ASCUS with NEGATIVE high risk HPV  Last mammogram: never Results were:    Obstetric History: G1P1001  Family History:  Family History  Problem Relation Age of Onset  . Arthritis Father   . Hyperlipidemia Father   . Hypertension Father   . Breast cancer Maternal Aunt     Social History:  Social History   Socioeconomic History  . Marital status: Single    Spouse name: Not on file  . Number of children: Not on file  . Years of education: Not on file  . Highest education level: Not  on file  Occupational History  . Not on file  Social Needs  . Financial resource strain: Not on file  . Food insecurity:    Worry: Not on file    Inability: Not on file  . Transportation needs:    Medical: Not on file    Non-medical: Not on file  Tobacco Use  . Smoking status: Never Smoker  . Smokeless tobacco: Never Used  Substance and Sexual Activity  . Alcohol use: No    Alcohol/week: 0.0 oz  . Drug use: No  . Sexual activity: Yes    Birth control/protection: Pill  Lifestyle  . Physical activity:    Days per week: 3 days    Minutes per session: 30 min  . Stress: Only a little  Relationships  . Social connections:    Talks on phone: Not on file    Gets together: Not on file    Attends religious service: Not on file    Active member of club or organization: Not on file    Attends meetings of clubs or organizations: Not on file    Relationship status: Not on file  . Intimate partner violence:    Fear of current or ex partner: Not on file    Emotionally abused: Not on file    Physically abused: Not on file    Forced sexual activity: Not on file  Other Topics Concern  . Not on file  Social History  Narrative   Engaged to be married.   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   Enjoys exercising, running, playing soccer, art, gardening.    Allergies:  Allergies  Allergen Reactions  . Amoxicillin Hives  . Penicillins Hives    Medications: Prior to Admission medications   Medication Sig Start Date End Date Taking? Authorizing Provider  busPIRone (BUSPAR) 10 MG tablet TAKE 1 TABLET (10 MG TOTAL) BY MOUTH 2 (TWO) TIMES DAILY. 07/01/17  Yes Doreene Nest, NP  LO LOESTRIN FE 1 MG-10 MCG / 10 MCG tablet Take 1 tablet by mouth daily. 06/07/17  Yes [provider]  Prenatal Vit-Fe Fumarate-FA (MULTIVITAMIN-PRENATAL) 27-0.8 MG TABS tablet Take 1 tablet by mouth daily at 12 noon.   Yes [provider]  sertraline (ZOLOFT) 100 MG tablet TAKE 1 TABLET BY  MOUTH EVERY DAY 06/13/17  Yes Nadara Mustard, MD    Physical Exam Vitals: Blood pressure 110/70, pulse (!) 57, height  (1.753 m), weight 179 lb (81.2 kg), currently breastfeeding.  General: NAD HEENT: normocephalic, anicteric Thyroid: no enlargement, no palpable nodules Pulmonary: No increased work of breathing, CTAB Cardiovascular: RRR, distal pulses 2+ Breast: Breast symmetrical, no tenderness, no palpable nodules or masses, no skin or nipple retraction present, no nipple discharge.  No axillary or supraclavicular lymphadenopathy. Abdomen: NABS, soft, non-tender, non-distended.  Umbilicus without lesions.  No hepatomegaly, splenomegaly or masses palpable. No evidence of hernia  Genitourinary:  External: Normal external female genitalia.  Normal urethral meatus, normal Bartholin's and Skene's glands.    Vagina: Normal vaginal mucosa, no evidence of prolapse.    Cervix: Grossly normal in appearance, no bleeding  Uterus: Non-enlarged, mobile, normal contour.  No CMT  Adnexa: ovaries non-enlarged, no adnexal masses  Rectal: deferred  Lymphatic: no evidence of inguinal lymphadenopathy Extremities: no edema, erythema, or tenderness Neurologic: Grossly intact Psychiatric: mood appropriate, affect full  Female chaperone present for pelvic and breast  portions of the physical exam    Assessment: 42 y.o. G1P1001 routine annual exam  Plan: Problem List Items Addressed This Visit    None      1) Mammogram - recommend yearly screening mammogram.  Patient is breast feeding. Mammogram is recommended after breast feeding is discontinued.   2) STI screening  was offered and accepted  3) ASCCP guidelines and rational discussed.  Patient opts for every 3 years screening interval  4) Contraception - the patient is currently using  OCP (estrogen/progesterone).  She is happy with her current form of contraception and plans to continue  5) Colonoscopy -- Screening recommended starting at  age 45 for average risk individuals, age 62 for individuals deemed at increased risk (including African Americans) and recommended to continue until age 4.  For patient age 72-85 individualized approach is recommended.  Gold standard screening is via colonoscopy, Cologuard screening is an acceptable alternative for patient unwilling or unable to undergo colonoscopy.  "Colorectal cancer screening for average?risk adults: 2018 guideline update from the American Cancer Society"CA: A Cancer Journal for Clinicians: Jul 11, 2016   6) Routine healthcare maintenance including cholesterol, diabetes screening discussed managed by PCP   7) Depression and anxiety, Zoloft prescription is refilled.   8) No follow-ups on file.   Adelene Idler MD [facility], [facility] 07/04/17 3:32 PM

## 2017-07-16 ENCOUNTER — Encounter: Payer: Self-pay | Admitting: Primary Care

## 2017-07-16 ENCOUNTER — Ambulatory Visit (INDEPENDENT_AMBULATORY_CARE_PROVIDER_SITE_OTHER): Payer: BLUE CROSS/BLUE SHIELD | Admitting: Primary Care

## 2017-07-16 VITALS — BP 118/78 | HR 68 | Temp 98.0°F | Ht 69.0 in | Wt 177.8 lb

## 2017-07-16 DIAGNOSIS — F411 Generalized anxiety disorder: Secondary | ICD-10-CM

## 2017-07-16 DIAGNOSIS — Z0000 Encounter for general adult medical examination without abnormal findings: Secondary | ICD-10-CM

## 2017-07-16 DIAGNOSIS — M542 Cervicalgia: Secondary | ICD-10-CM | POA: Diagnosis not present

## 2017-07-16 DIAGNOSIS — G8929 Other chronic pain: Secondary | ICD-10-CM

## 2017-07-16 NOTE — Assessment & Plan Note (Signed)
Feels well managed on Zoloft 100 mg and Buspar, continue same.

## 2017-07-16 NOTE — Progress Notes (Signed)
Subjective:    Patient ID: Cynthia Diaz, female    DOB: [birth year], 42 y.o.   MRN: [id number]  HPI  Cynthia Diaz is a 42 year old female who presents today for complete physical.  Immunizations: -Tetanus: Completed in 2017 -Influenza: Completed last season    Diet: She endorses a healthy diet Breakfast: Cereal  Lunch: Salad, wrap Dinner: Cereal, meat, starch, vegetables  Snacks: Crackers, nuts Desserts: Occasionally  Beverages: Water, coffee, diet soda, flavored water, sugar free powerade  Exercise: She is running sometimes Eye exam: Completed in 2019 Dental exam: No recent exam Pap Smear: Following with GYN, completed in 2018 Mammogram: Due, breast feeding.    Review of Systems  Constitutional: Negative for unexpected weight change.  HENT: Negative for rhinorrhea.   Respiratory: Negative for cough and shortness of breath.   Cardiovascular: Negative for chest pain.  Gastrointestinal: Negative for constipation and diarrhea.  Genitourinary: Negative for difficulty urinating and menstrual problem.  Musculoskeletal: Negative for arthralgias and myalgias.  Skin: Negative for rash.  Allergic/Immunologic: Negative for environmental allergies.  Neurological: Negative for dizziness, numbness and headaches.  Psychiatric/Behavioral:       Feels well managed on current regimen       Past Medical History:  Diagnosis Date  . Alcohol abuse   . Chicken pox   . Elevated blood pressure   . Generalized anxiety disorder   . Kidney stones      Social History   Socioeconomic History  . Marital status: Single    Spouse name: Not on file  . Number of children: Not on file  . Years of education: Not on file  . Highest education level: Not on file  Occupational History  . Not on file  Social Needs  . Financial resource strain: Not on file  . Food insecurity:    Worry: Not on file    Inability: Not on file  . Transportation needs:    Medical: Not on file    Non-medical:  Not on file  Tobacco Use  . Smoking status: Never Smoker  . Smokeless tobacco: Never Used  Substance and Sexual Activity  . Alcohol use: No    Alcohol/week: 0.0 oz  . Drug use: No  . Sexual activity: Yes    Birth control/protection: Pill  Lifestyle  . Physical activity:    Days per week: 3 days    Minutes per session: 30 min  . Stress: Only a little  Relationships  . Social connections:    Talks on phone: Not on file    Gets together: Not on file    Attends religious service: Not on file    Active member of club or organization: Not on file    Attends meetings of clubs or organizations: Not on file    Relationship status: Not on file  . Intimate partner violence:    Fear of current or ex partner: Not on file    Emotionally abused: Not on file    Physically abused: Not on file    Forced sexual activity: Not on file  Other Topics Concern  . 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   Enjoys exercising, running, playing soccer, art, gardening.    Past Surgical History:  Procedure Laterality Date  . COLPOSCOPY  2005  . KNEE SURGERY Left   . WISDOM TOOTH EXTRACTION      Family History  Problem Relation Age of Onset  . Arthritis Father   . Hyperlipidemia Father   . Hypertension Father   . Breast cancer Maternal Aunt     Allergies  Allergen Reactions  . Amoxicillin Hives  . Penicillins Hives    Current Outpatient Medications on File Prior to Visit  Medication Sig Dispense Refill  . busPIRone (BUSPAR) 10 MG tablet TAKE 1 TABLET (10 MG TOTAL) BY MOUTH 2 (TWO) TIMES DAILY. 180 tablet 0  . LO LOESTRIN FE 1 MG-10 MCG / 10 MCG tablet Take 1 tablet by mouth daily.  3  . Prenatal Vit-Fe Fumarate-FA (MULTIVITAMIN-PRENATAL) 27-0.8 MG TABS tablet Take 1 tablet by mouth daily at 12 noon.    . sertraline (ZOLOFT) 100 MG tablet Take 1 tablet (100 mg total) by mouth daily. 30 tablet 11   No current  facility-administered medications on file prior to visit.     BP 118/78   Pulse 68   Temp 98 F (36.7 C) (Oral)   Ht 5\' 9"  (1.753 m)   Wt 177 lb 12 oz (80.6 kg)   SpO2 98%   BMI 26.25 kg/m    Objective:   Physical Exam  Constitutional: She is oriented to person, place, and time. She appears well-nourished.  HENT:  Mouth/Throat: No oropharyngeal exudate.  Eyes: Pupils are equal, round, and reactive to light. EOM are normal.  Neck: Neck supple. No thyromegaly present.  Cardiovascular: Normal rate and regular rhythm.  Respiratory: Effort normal and breath sounds normal.  GI: Soft. Bowel sounds are normal. There is no tenderness.  Musculoskeletal: Normal range of motion.  Neurological: She is alert and oriented to person, place, and time.  Skin: Skin is warm and dry.  Psychiatric: She has a normal mood and affect.           Assessment & Plan:

## 2017-07-16 NOTE — Assessment & Plan Note (Signed)
Immunizations UTD. Pap smear UTD. Mammogram deferred until no longer breast feeding. Encouraged regular exercise, healthy diet. Exam unremarkable. Labs unremarkable. Follow up in 1 year for CPE.

## 2017-07-16 NOTE — Assessment & Plan Note (Signed)
Doing better overall with massages, continues to experience breakthrough pain. She will update if pain progresses.

## 2017-07-16 NOTE — Patient Instructions (Signed)
Continue exercising. You should be getting 150 minutes of moderate intensity exercise weekly.  Continue to eat a healthy diet.  Ensure you are consuming 64 ounces of water daily.  Follow up in 1 year for your annual exam or sooner if needed.  It was a pleasure to see you today!   Preventive Care 40-64 Years, Female Preventive care refers to lifestyle choices and visits with your health care provider that can promote health and wellness. What does preventive care include?  A yearly physical exam. This is also called an annual well check.  Dental exams once or twice a year.  Routine eye exams. Ask your health care provider how often you should have your eyes checked.  Personal lifestyle choices, including: ? Daily care of your teeth and gums. ? Regular physical activity. ? Eating a healthy diet. ? Avoiding tobacco and drug use. ? Limiting alcohol use. ? Practicing safe sex. ? Taking low-dose aspirin daily starting at age 58. ? Taking vitamin and mineral supplements as recommended by your health care provider. What happens during an annual well check? The services and screenings done by your health care provider during your annual well check will depend on your age, overall health, lifestyle risk factors, and family history of disease. Counseling Your health care provider may ask you questions about your:  Alcohol use.  Tobacco use.  Drug use.  Emotional well-being.  Home and relationship well-being.  Sexual activity.  Eating habits.  Work and work Statistician.  Method of birth control.  Menstrual cycle.  Pregnancy history.  Screening You may have the following tests or measurements:  Height, weight, and BMI.  Blood pressure.  Lipid and cholesterol levels. These may be checked every 5 years, or more frequently if you are over 25 years old.  Skin check.  Lung cancer screening. You may have this screening every year starting at age 38 if you have a  30-pack-year history of smoking and currently smoke or have quit within the past 15 years.  Fecal occult blood test (FOBT) of the stool. You may have this test every year starting at age 11.  Flexible sigmoidoscopy or colonoscopy. You may have a sigmoidoscopy every 5 years or a colonoscopy every 10 years starting at age 68.  Hepatitis C blood test.  Hepatitis B blood test.  Sexually transmitted disease (STD) testing.  Diabetes screening. This is done by checking your blood sugar (glucose) after you have not eaten for a while (fasting). You may have this done every 1-3 years.  Mammogram. This may be done every 1-2 years. Talk to your health care provider about when you should start having regular mammograms. This may depend on whether you have a family history of breast cancer.  BRCA-related cancer screening. This may be done if you have a family history of breast, ovarian, tubal, or peritoneal cancers.  Pelvic exam and Pap test. This may be done every 3 years starting at age 47. Starting at age 50, this may be done every 5 years if you have a Pap test in combination with an HPV test.  Bone density scan. This is done to screen for osteoporosis. You may have this scan if you are at high risk for osteoporosis.  Discuss your test results, treatment options, and if necessary, the need for more tests with your health care provider. Vaccines Your health care provider may recommend certain vaccines, such as:  Influenza vaccine. This is recommended every year.  Tetanus, diphtheria, and acellular pertussis (Tdap,  Td) vaccine. You may need a Td booster every 10 years.  Varicella vaccine. You may need this if you have not been vaccinated.  Zoster vaccine. You may need this after age 65.  Measles, mumps, and rubella (MMR) vaccine. You may need at least one dose of MMR if you were born in 1957 or later. You may also need a second dose.  Pneumococcal 13-valent conjugate (PCV13) vaccine. You may  need this if you have certain conditions and were not previously vaccinated.  Pneumococcal polysaccharide (PPSV23) vaccine. You may need one or two doses if you smoke cigarettes or if you have certain conditions.  Meningococcal vaccine. You may need this if you have certain conditions.  Hepatitis A vaccine. You may need this if you have certain conditions or if you travel or work in places where you may be exposed to hepatitis A.  Hepatitis B vaccine. You may need this if you have certain conditions or if you travel or work in places where you may be exposed to hepatitis B.  Haemophilus influenzae type b (Hib) vaccine. You may need this if you have certain conditions.  Talk to your health care provider about which screenings and vaccines you need and how often you need them. This information is not intended to replace advice given to you by your health care provider. Make sure you discuss any questions you have with your health care provider. Document Released: 02/25/2015 Document Revised: 10/19/2015 Document Reviewed: 11/30/2014 Elsevier Interactive Patient Education  [employer].

## 2017-09-28 ENCOUNTER — Other Ambulatory Visit: Payer: Self-pay | Admitting: Primary Care

## 2017-09-28 DIAGNOSIS — G4700 Insomnia, unspecified: Secondary | ICD-10-CM

## 2017-09-30 ENCOUNTER — Other Ambulatory Visit: Payer: Self-pay | Admitting: Primary Care

## 2017-09-30 DIAGNOSIS — F411 Generalized anxiety disorder: Secondary | ICD-10-CM

## 2017-10-03 ENCOUNTER — Other Ambulatory Visit: Payer: Self-pay | Admitting: Obstetrics and Gynecology

## 2017-10-07 NOTE — Telephone Encounter (Signed)
You can approve this. The computer isnt giving me the button to press to approve it, you might need to call the pharmacy. Thank you,  Dr. Jerene PitchSchuman

## 2017-10-07 NOTE — Telephone Encounter (Signed)
Please advise. Thank you

## 2018-03-07 ENCOUNTER — Other Ambulatory Visit: Payer: Self-pay | Admitting: Obstetrics and Gynecology

## 2018-03-10 NOTE — Telephone Encounter (Signed)
Do you want me to refill, sense it is not you that originally prescribed it?

## 2018-03-20 ENCOUNTER — Other Ambulatory Visit: Payer: Self-pay | Admitting: Primary Care

## 2018-03-20 DIAGNOSIS — F411 Generalized anxiety disorder: Secondary | ICD-10-CM

## 2018-09-14 ENCOUNTER — Other Ambulatory Visit: Payer: Self-pay | Admitting: Primary Care

## 2018-09-14 DIAGNOSIS — F411 Generalized anxiety disorder: Secondary | ICD-10-CM

## 2018-09-17 NOTE — Telephone Encounter (Signed)
Noted. Notified patient that appointment is needed for any more refills

## 2018-09-17 NOTE — Telephone Encounter (Signed)
Patient called about refill for Buspirone.  Patient said she's out of her medication.  [facility] [place].

## 2018-09-26 ENCOUNTER — Other Ambulatory Visit: Payer: Self-pay | Admitting: Obstetrics and Gynecology

## 2018-10-09 ENCOUNTER — Other Ambulatory Visit: Payer: Self-pay | Admitting: Primary Care

## 2018-10-09 DIAGNOSIS — F411 Generalized anxiety disorder: Secondary | ICD-10-CM

## 2018-10-27 DIAGNOSIS — F411 Generalized anxiety disorder: Secondary | ICD-10-CM

## 2018-10-27 MED ORDER — BUSPIRONE HCL 10 MG PO TABS: 10.0000 mg | ORAL_TABLET | Freq: Two times a day (BID) | ORAL | 0 refills | Status: DC

## 2018-11-19 ENCOUNTER — Encounter: Payer: Self-pay | Admitting: Primary Care

## 2018-11-19 ENCOUNTER — Other Ambulatory Visit: Payer: Self-pay

## 2018-11-19 ENCOUNTER — Ambulatory Visit (INDEPENDENT_AMBULATORY_CARE_PROVIDER_SITE_OTHER): Payer: BC Managed Care – PPO | Admitting: Primary Care

## 2018-11-19 VITALS — BP 120/70 | HR 82 | Temp 97.8°F | Ht 69.0 in | Wt 169.8 lb

## 2018-11-19 DIAGNOSIS — M542 Cervicalgia: Secondary | ICD-10-CM | POA: Diagnosis not present

## 2018-11-19 DIAGNOSIS — F411 Generalized anxiety disorder: Secondary | ICD-10-CM | POA: Diagnosis not present

## 2018-11-19 DIAGNOSIS — G4700 Insomnia, unspecified: Secondary | ICD-10-CM

## 2018-11-19 DIAGNOSIS — Z0000 Encounter for general adult medical examination without abnormal findings: Secondary | ICD-10-CM

## 2018-11-19 DIAGNOSIS — Z23 Encounter for immunization: Secondary | ICD-10-CM | POA: Diagnosis not present

## 2018-11-19 DIAGNOSIS — G8929 Other chronic pain: Secondary | ICD-10-CM

## 2018-11-19 NOTE — Assessment & Plan Note (Signed)
Denies insomnia.

## 2018-11-19 NOTE — Progress Notes (Signed)
Subjective:    Patient ID: Cynthia Diaz, female    DOB: [birth year], 43 y.o.   MRN: [id number]  HPI  Cynthia Diaz is a 43 year old female who presents today for complete physical.  Immunizations: -Tetanus: Completed in 2017 -Influenza: Due today  Diet: She endorses a healthy diet with vegetables, protein, salads, occasional junk food. Desserts on occasion. Drinking mostly water an carbonated water. Exercise: She is running 4-5 times weekly   Eye exam: Completed in 2020 Dental exam: Completes semi-annually  Pap Smear: UTD per patient, will see GYN Mammogram: Never completed   BP Readings from Last 3 Encounters:  11/19/18 120/70  07/16/17 118/78  07/04/17 110/70     Review of Systems  Constitutional: Negative for unexpected weight change.  HENT: Negative for rhinorrhea.   Respiratory: Negative for cough and shortness of breath.   Cardiovascular: Negative for chest pain.  Gastrointestinal: Negative for constipation and diarrhea.  Genitourinary: Negative for difficulty urinating and menstrual problem.  Musculoskeletal: Positive for neck pain.       Chronic neck pain  Skin: Negative for rash.  Allergic/Immunologic: Negative for environmental allergies.  Neurological: Negative for dizziness, numbness and headaches.  Psychiatric/Behavioral:       Feels well managed on current regimen.       Past Medical History:  Diagnosis Date  . Advanced maternal age, 1st pregnancy, unspecified trimester 07/02/2016   [facility] Dating  Blood type:    Genetic Screen 1 Screen:    AFP:     Quad:     NIPS: Antibody:  Anatomic US  Rubella:   Varicella: I GTT Early:               Third trimester:  RPR:    Rhogam  HBsAg:    TDaP vaccine                       Flu Shot: HIV:    Baby Food                                GBS:  Contraception  Pap: 06/01/15 ASCUS HPV negative CBB    CS/VBAC   Support Pers  . Alcohol abuse   . Chicken pox   . Elevated blood pressure   . Generalized  anxiety disorder   . Kidney stones   . Supervision of high risk pregnancy, antepartum 07/11/2016   [facility] Prenatal Labs Dating 12 week US Blood type: O/Positive/-- (05/21 1523)  Genetic Screen NIPS: XX diploid Antibody:Negative (05/21 1523) Anatomic US Normal Rubella: 1.90 (05/21 1523) Varicella: Immune GTT 77 RPR: Non Reactive (09/12 1553)  Rhogam n/a HBsAg: Negative (05/21 1523)  TDaP vaccine 11/1            Flu Shot: 10/10 HIV:   Neg Baby Food Breast                       GBS: Posit  . Vacuum extraction, delivered, current hospitalization 01/21/2017     Social History   Socioeconomic History  . Marital status: Single    Spouse name: Not on file  . Number of children: Not on file  . Years of education: Not on file  . Highest education level: Not on file  Occupational History  . Not on file  Social Needs  . Financial resource strain: Not on file  .  Food insecurity    Worry: Not on file    Inability: Not on file  . Transportation needs    Medical: Not on file    Non-medical: Not on file  Tobacco Use  . Smoking status: Never Smoker  . Smokeless tobacco: Never Used  Substance and Sexual Activity  . Alcohol use: No    Alcohol/week: 0.0 standard drinks  . Drug use: No  . Sexual activity: Yes    Birth control/protection: Pill  Lifestyle  . Physical activity    Days per week: 3 days    Minutes per session: 30 min  . Stress: Only a little  Relationships  . Social Musician on phone: Not on file    Gets together: Not on file    Attends religious service: Not on file    Active member of club or organization: Not on file    Attends meetings of clubs or organizations: Not on file    Relationship status: Not on file  . Intimate partner violence    Fear of current or ex partner: Not on file    Emotionally abused: Not on file    Physically abused: Not on file    Forced sexual activity: Not on file  Other Topics Concern  . Not on file  Social History  Narrative   Engaged to be married.   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   Enjoys exercising, running, playing soccer, art, gardening.    Past Surgical History:  Procedure Laterality Date  . COLPOSCOPY  2005  . KNEE SURGERY Left   . WISDOM TOOTH EXTRACTION      Family History  Problem Relation Age of Onset  . Arthritis Father   . Hyperlipidemia Father   . Hypertension Father   . Breast cancer Maternal Aunt     Allergies  Allergen Reactions  . Amoxicillin Hives  . Penicillins Hives    Current Outpatient Medications on File Prior to Visit  Medication Sig Dispense Refill  . busPIRone (BUSPAR) 10 MG tablet Take 1 tablet (10 mg total) by mouth 2 (two) times daily. 180 tablet 0  . LO LOESTRIN FE 1 MG-10 MCG / 10 MCG tablet TAKE 1 TABLET BY MOUTH EVERY DAY 84 tablet 3  . Prenatal Vit-Fe Fumarate-FA (MULTIVITAMIN-PRENATAL) 27-0.8 MG TABS tablet Take 1 tablet by mouth daily at 12 noon.    . sertraline (ZOLOFT) 100 MG tablet TAKE 1 TABLET BY MOUTH EVERY DAY 90 tablet 3   No current facility-administered medications on file prior to visit.     BP 120/70   Pulse 82   Temp 97.8 F (36.6 C) (Temporal)   Ht 5\' 9"  (1.753 m)   Wt 169 lb 12 oz (77 kg)   LMP 10/19/2018   SpO2 97%   Breastfeeding Yes   BMI 25.07 kg/m    Objective:   Physical Exam  Constitutional: She is oriented to person, place, and time. She appears well-nourished.  HENT:  Right Ear: Tympanic membrane and ear canal normal.  Left Ear: Tympanic membrane and ear canal normal.  Mouth/Throat: Oropharynx is clear and moist.  Eyes: Pupils are equal, round, and reactive to light. EOM are normal.  Neck: Neck supple.  Cardiovascular: Normal rate and regular rhythm.  Respiratory: Effort normal and breath sounds normal.  GI: Soft. Bowel sounds are normal. There is no abdominal tenderness.  Musculoskeletal: Normal range of motion.  Neurological: She is alert and oriented to person, place, and  time.  Skin: Skin  is warm and dry.  Psychiatric: She has a normal mood and affect.           Assessment & Plan:

## 2018-11-19 NOTE — Addendum Note (Signed)
Addended by: Jacqualin Combes on: 11/19/2018 04:11 PM   Modules accepted: Orders

## 2018-11-19 NOTE — Assessment & Plan Note (Signed)
Doing well on Zoloft 100 mg and Buspar PRN. Continue same.

## 2018-11-19 NOTE — Assessment & Plan Note (Signed)
Tetanus UTD, influenza vaccination provided. Pap smear UTD per patient, follows with GYN. Mammogram overdue, she is still breast feeding. We will find out if this can be done while breastfeeding.  Encouraged regular exercise, healthy diet. Exam today unremarkable. Labs pending.

## 2018-11-19 NOTE — Assessment & Plan Note (Signed)
Chronic and continued, working on exercise/stretching/massage. Overall manages on her own. Continue to monitor.

## 2018-11-19 NOTE — Patient Instructions (Signed)
Stop by the lab prior to leaving today. I will notify you of your results once received.   Continue exercising. You should be getting 150 minutes of moderate intensity exercise weekly.  Continue to eat a healthy diet. Ensure you are consuming 64 ounces of water daily.  Please notify me once you find out the date of your last pap smear.  It was a pleasure to see you today!   Preventive Care 54-43 Years Old, Female Preventive care refers to visits with your health care provider and lifestyle choices that can promote health and wellness. This includes:  A yearly physical exam. This may also be called an annual well check.  Regular dental visits and eye exams.  Immunizations.  Screening for certain conditions.  Healthy lifestyle choices, such as eating a healthy diet, getting regular exercise, not using drugs or products that contain nicotine and tobacco, and limiting alcohol use. What can I expect for my preventive care visit? Physical exam Your health care provider will check your:  Height and weight. This may be used to calculate body mass index (BMI), which tells if you are at a healthy weight.  Heart rate and blood pressure.  Skin for abnormal spots. Counseling Your health care provider may ask you questions about your:  Alcohol, tobacco, and drug use.  Emotional well-being.  Home and relationship well-being.  Sexual activity.  Eating habits.  Work and work Statistician.  Method of birth control.  Menstrual cycle.  Pregnancy history. What immunizations do I need?  Influenza (flu) vaccine  This is recommended every year. Tetanus, diphtheria, and pertussis (Tdap) vaccine  You may need a Td booster every 10 years. Varicella (chickenpox) vaccine  You may need this if you have not been vaccinated. Zoster (shingles) vaccine  You may need this after age 22. Measles, mumps, and rubella (MMR) vaccine  You may need at least one dose of MMR if you were born in  1957 or later. You may also need a second dose. Pneumococcal conjugate (PCV13) vaccine  You may need this if you have certain conditions and were not previously vaccinated. Pneumococcal polysaccharide (PPSV23) vaccine  You may need one or two doses if you smoke cigarettes or if you have certain conditions. Meningococcal conjugate (MenACWY) vaccine  You may need this if you have certain conditions. Hepatitis A vaccine  You may need this if you have certain conditions or if you travel or work in places where you may be exposed to hepatitis A. Hepatitis B vaccine  You may need this if you have certain conditions or if you travel or work in places where you may be exposed to hepatitis B. Haemophilus influenzae type b (Hib) vaccine  You may need this if you have certain conditions. Human papillomavirus (HPV) vaccine  If recommended by your health care provider, you may need three doses over 6 months. You may receive vaccines as individual doses or as more than one vaccine together in one shot (combination vaccines). Talk with your health care provider about the risks and benefits of combination vaccines. What tests do I need? Blood tests  Lipid and cholesterol levels. These may be checked every 5 years, or more frequently if you are over 30 years old.  Hepatitis C test.  Hepatitis B test. Screening  Lung cancer screening. You may have this screening every year starting at age 69 if you have a 30-pack-year history of smoking and currently smoke or have quit within the past 15 years.  Colorectal cancer  screening. All adults should have this screening starting at age 96 and continuing until age 15. Your health care provider may recommend screening at age 38 if you are at increased risk. You will have tests every 1-10 years, depending on your results and the type of screening test.  Diabetes screening. This is done by checking your blood sugar (glucose) after you have not eaten for a  while (fasting). You may have this done every 1-3 years.  Mammogram. This may be done every 1-2 years. Talk with your health care provider about when you should start having regular mammograms. This may depend on whether you have a family history of breast cancer.  BRCA-related cancer screening. This may be done if you have a family history of breast, ovarian, tubal, or peritoneal cancers.  Pelvic exam and Pap test. This may be done every 3 years starting at age 40. Starting at age 23, this may be done every 5 years if you have a Pap test in combination with an HPV test. Other tests  Sexually transmitted disease (STD) testing.  Bone density scan. This is done to screen for osteoporosis. You may have this scan if you are at high risk for osteoporosis. Follow these instructions at home: Eating and drinking  Eat a diet that includes fresh fruits and vegetables, whole grains, lean protein, and low-fat dairy.  Take vitamin and mineral supplements as recommended by your health care provider.  Do not drink alcohol if: ? Your health care provider tells you not to drink. ? You are pregnant, may be pregnant, or are planning to become pregnant.  If you drink alcohol: ? Limit how much you have to 0-1 drink a day. ? Be aware of how much alcohol is in your drink. In the [place] one drink equals one 12 oz bottle of beer (355 mL), one 5 oz glass of wine (148 mL), or one 1 oz glass of hard liquor (44 mL). Lifestyle  Take daily care of your teeth and gums.  Stay active. Exercise for at least 30 minutes on 5 or more days each week.  Do not use any products that contain nicotine or tobacco, such as cigarettes, e-cigarettes, and chewing tobacco. If you need help quitting, ask your health care provider.  If you are sexually active, practice safe sex. Use a condom or other form of birth control (contraception) in order to prevent pregnancy and STIs (sexually transmitted infections).  If told by your  health care provider, take low-dose aspirin daily starting at age 71. What's next?  Visit your health care provider once a year for a well check visit.  Ask your health care provider how often you should have your eyes and teeth checked.  Stay up to date on all vaccines. This information is not intended to replace advice given to you by your health care provider. Make sure you discuss any questions you have with your health care provider. Document Released: 02/25/2015 Document Revised: 10/10/2017 Document Reviewed: 10/10/2017 Elsevier Patient Education  2020 [employer].

## 2018-11-20 ENCOUNTER — Telehealth: Payer: Self-pay | Admitting: Primary Care

## 2018-11-20 LAB — CBC
HCT: 42 % (ref 36.0–46.0)
Hemoglobin: 14.2 g/dL (ref 12.0–15.0)
MCHC: 33.8 g/dL (ref 30.0–36.0)
MCV: 91 fl (ref 78.0–100.0)
Platelets: 307 10*3/uL (ref 150.0–400.0)
RBC: 4.62 Mil/uL (ref 3.87–5.11)
RDW: 12.7 % (ref 11.5–15.5)
WBC: 7 10*3/uL (ref 4.0–10.5)

## 2018-11-20 LAB — COMPREHENSIVE METABOLIC PANEL
ALT: 10 U/L (ref 0–35)
AST: 15 U/L (ref 0–37)
Albumin: 4.4 g/dL (ref 3.5–5.2)
Alkaline Phosphatase: 67 U/L (ref 39–117)
BUN: 15 mg/dL (ref 6–23)
CO2: 27 mEq/L (ref 19–32)
Calcium: 10 mg/dL (ref 8.4–10.5)
Chloride: 102 mEq/L (ref 96–112)
Creatinine, Ser: 0.99 mg/dL (ref 0.40–1.20)
GFR: 61.18 mL/min (ref >60.00)
Glucose, Bld: 80 mg/dL (ref 70–99)
Potassium: 4.3 mEq/L (ref 3.5–5.1)
Sodium: 137 mEq/L (ref 135–145)
Total Bilirubin: 0.4 mg/dL (ref 0.2–1.2)
Total Protein: 7.4 g/dL (ref 6.0–8.3)

## 2018-11-20 LAB — LIPID PANEL
Cholesterol: 175 mg/dL (ref 0–200)
HDL: 56 mg/dL (ref >39.00)
LDL Cholesterol: 98 mg/dL (ref 0–99)
NonHDL: 119.08
Total CHOL/HDL Ratio: 3
Triglycerides: 103 mg/dL (ref 0.0–149.0)
VLDL: 20.6 mg/dL (ref 0.0–40.0)

## 2019-01-15 ENCOUNTER — Other Ambulatory Visit: Payer: Self-pay | Admitting: Primary Care

## 2019-01-15 DIAGNOSIS — F411 Generalized anxiety disorder: Secondary | ICD-10-CM

## 2019-02-24 ENCOUNTER — Other Ambulatory Visit: Payer: Self-pay | Admitting: Obstetrics and Gynecology

## 2019-07-08 DIAGNOSIS — F411 Generalized anxiety disorder: Secondary | ICD-10-CM

## 2019-07-08 MED ORDER — BUSPIRONE HCL 10 MG PO TABS: ORAL_TABLET | ORAL | 1 refills | Status: DC

## 2019-09-25 ENCOUNTER — Telehealth (INDEPENDENT_AMBULATORY_CARE_PROVIDER_SITE_OTHER): Payer: BC Managed Care – PPO | Admitting: Family Medicine

## 2019-09-25 ENCOUNTER — Other Ambulatory Visit: Payer: Self-pay

## 2019-09-25 ENCOUNTER — Encounter: Payer: Self-pay | Admitting: Family Medicine

## 2019-09-25 DIAGNOSIS — J029 Acute pharyngitis, unspecified: Secondary | ICD-10-CM

## 2019-09-25 MED ORDER — AZITHROMYCIN 250 MG PO TABS: ORAL_TABLET | ORAL | 0 refills | Status: DC

## 2019-09-25 NOTE — Progress Notes (Signed)
Virtual visit completed through WebEx or similar program Patient location: home  Provider location: [place] at [facility], office  Participants: Patient and me (unless stated otherwise below)  Pandemic considerations d/w pt.   Limitations and rationale for visit method d/w patient.  Patient agreed to proceed.   CC: URI.    HPI: Daughter had sx about 10 days ago, fever 102 and vomiting.  She is better in the meantime.    Now patient has ST, temp up to 100 recently but normal temp now.  No cough but sneezing.  Post nasal gtt.  She is leaving town next week, to [place].  No vomiting, no diarrhea.  No rash.  No wheeze.   No sputum.  She isn't breastfeeding.  No loss or taste or smell.    D/w pt about covid testing, options d/w pt.  She can check on getting that done at pharmacy.    She has fever, no cough, ST, she has noted some lymph node enlargement with no exudates.  Tylenol helps along with ST.    covid vaccine prev done.    Meds and allergies reviewed.   ROS: Per HPI unless specifically indicated in ROS section   NAD Speech wnl  A/P: Presumed strep throat.  She has fever, no cough, sore throat, and likely with lymph node enlargement.  She may not have yet had time to develop tonsil exudates.. rx sent for zmax, cautions d/w pt.  Supportive tx in the meantime.  She'll check on covid testing in the meantime but she is low risk.

## 2019-09-28 DIAGNOSIS — J029 Acute pharyngitis, unspecified: Secondary | ICD-10-CM | POA: Insufficient documentation

## 2019-09-28 NOTE — Assessment & Plan Note (Signed)
  Presumed strep throat.  She has fever, no cough, sore throat, and likely with lymph node enlargement.  She may not have yet had time to develop tonsil exudates.. rx sent for zmax, cautions d/w pt.  Supportive tx in the meantime.  She'll check on covid testing in the meantime but she is low risk.

## 2019-10-02 ENCOUNTER — Other Ambulatory Visit: Payer: Self-pay | Admitting: Obstetrics and Gynecology

## 2019-10-05 DIAGNOSIS — F411 Generalized anxiety disorder: Secondary | ICD-10-CM

## 2019-10-05 MED ORDER — SERTRALINE HCL 100 MG PO TABS: 100.0000 mg | ORAL_TABLET | Freq: Every day | ORAL | 0 refills | Status: DC

## 2019-10-05 NOTE — Telephone Encounter (Signed)
Have not prescribed. Last was OB/GYN Last OV (cpe ) with Cynthia Diaz on 11/19/2018 No future OV scheduled

## 2019-12-28 ENCOUNTER — Other Ambulatory Visit: Payer: Self-pay | Admitting: Primary Care

## 2019-12-28 DIAGNOSIS — F411 Generalized anxiety disorder: Secondary | ICD-10-CM

## 2020-01-03 ENCOUNTER — Other Ambulatory Visit: Payer: Self-pay | Admitting: Primary Care

## 2020-01-03 DIAGNOSIS — Z0000 Encounter for general adult medical examination without abnormal findings: Secondary | ICD-10-CM

## 2020-01-11 ENCOUNTER — Other Ambulatory Visit (INDEPENDENT_AMBULATORY_CARE_PROVIDER_SITE_OTHER): Payer: BC Managed Care – PPO

## 2020-01-11 ENCOUNTER — Other Ambulatory Visit: Payer: Self-pay

## 2020-01-11 DIAGNOSIS — Z0000 Encounter for general adult medical examination without abnormal findings: Secondary | ICD-10-CM | POA: Diagnosis not present

## 2020-01-11 LAB — LIPID PANEL
Cholesterol: 128 mg/dL (ref 0–200)
HDL: 37.3 mg/dL — ABNORMAL LOW (ref >39.00)
LDL Cholesterol: 71 mg/dL (ref 0–99)
NonHDL: 90.75
Total CHOL/HDL Ratio: 3
Triglycerides: 98 mg/dL (ref 0.0–149.0)
VLDL: 19.6 mg/dL (ref 0.0–40.0)

## 2020-01-11 LAB — CBC
HCT: 42.5 % (ref 36.0–46.0)
Hemoglobin: 14.8 g/dL (ref 12.0–15.0)
MCHC: 34.8 g/dL (ref 30.0–36.0)
MCV: 87 fl (ref 78.0–100.0)
Platelets: 295 10*3/uL (ref 150.0–400.0)
RBC: 4.89 Mil/uL (ref 3.87–5.11)
RDW: 11.9 % (ref 11.5–15.5)
WBC: 3.7 10*3/uL — ABNORMAL LOW (ref 4.0–10.5)

## 2020-01-11 LAB — COMPREHENSIVE METABOLIC PANEL
ALT: 46 U/L — ABNORMAL HIGH (ref 0–35)
AST: 33 U/L (ref 0–37)
Albumin: 4.2 g/dL (ref 3.5–5.2)
Alkaline Phosphatase: 61 U/L (ref 39–117)
BUN: 5 mg/dL — ABNORMAL LOW (ref 6–23)
CO2: 28 mEq/L (ref 19–32)
Calcium: 9.2 mg/dL (ref 8.4–10.5)
Chloride: 104 mEq/L (ref 96–112)
Creatinine, Ser: 1.01 mg/dL (ref 0.40–1.20)
GFR: 67.81 mL/min (ref >60.00)
Glucose, Bld: 115 mg/dL — ABNORMAL HIGH (ref 70–99)
Potassium: 3.4 mEq/L — ABNORMAL LOW (ref 3.5–5.1)
Sodium: 140 mEq/L (ref 135–145)
Total Bilirubin: 0.4 mg/dL (ref 0.2–1.2)
Total Protein: 7.1 g/dL (ref 6.0–8.3)

## 2020-01-20 ENCOUNTER — Other Ambulatory Visit: Payer: Self-pay

## 2020-01-20 ENCOUNTER — Encounter: Payer: Self-pay | Admitting: Primary Care

## 2020-01-20 ENCOUNTER — Ambulatory Visit (INDEPENDENT_AMBULATORY_CARE_PROVIDER_SITE_OTHER): Payer: BC Managed Care – PPO | Admitting: Primary Care

## 2020-01-20 VITALS — BP 120/74 | HR 82 | Temp 97.6°F | Ht 69.0 in | Wt 153.0 lb

## 2020-01-20 DIAGNOSIS — Z0000 Encounter for general adult medical examination without abnormal findings: Secondary | ICD-10-CM

## 2020-01-20 DIAGNOSIS — Z23 Encounter for immunization: Secondary | ICD-10-CM | POA: Diagnosis not present

## 2020-01-20 DIAGNOSIS — K219 Gastro-esophageal reflux disease without esophagitis: Secondary | ICD-10-CM

## 2020-01-20 DIAGNOSIS — Z1231 Encounter for screening mammogram for malignant neoplasm of breast: Secondary | ICD-10-CM

## 2020-01-20 DIAGNOSIS — N939 Abnormal uterine and vaginal bleeding, unspecified: Secondary | ICD-10-CM

## 2020-01-20 DIAGNOSIS — F411 Generalized anxiety disorder: Secondary | ICD-10-CM | POA: Diagnosis not present

## 2020-01-20 HISTORY — DX: Abnormal uterine and vaginal bleeding, unspecified: N93.9

## 2020-01-20 MED ORDER — SERTRALINE HCL 100 MG PO TABS: 100.0000 mg | ORAL_TABLET | Freq: Every day | ORAL | 2 refills | Status: DC

## 2020-01-20 MED ORDER — BUSPIRONE HCL 10 MG PO TABS: ORAL_TABLET | ORAL | 2 refills | Status: DC

## 2020-01-20 NOTE — Assessment & Plan Note (Addendum)
Menses occurring every 2 weeks since October 2021.  Discussed differentials with patient today including uterine fibroids, other abnormality to the uterus, stress, abrupt dietary changes, hormone levels.  I offered to order a transvaginal and pelvic ultrasound, she currently declines and would like to see her GYN first.  We will also check a TSH today amongst other labs to rule out metabolic cause.  Referral placed back to her GYN provider today per her request.

## 2020-01-20 NOTE — Patient Instructions (Addendum)
Stop by the lab prior to leaving today. I will notify you of your results once received.   Call [facility] for an appointment.  I will also place a referral.  Continue to work on a healthy diet and regular exercise.  [facility] to schedule your mammogram.   It was a pleasure to see you today!   Preventive Care 9-44 Years Old, Female Preventive care refers to visits with your health care provider and lifestyle choices that can promote health and wellness. This includes:  A yearly physical exam. This may also be called an annual well check.  Regular dental visits and eye exams.  Immunizations.  Screening for certain conditions.  Healthy lifestyle choices, such as eating a healthy diet, getting regular exercise, not using drugs or products that contain nicotine and tobacco, and limiting alcohol use. What can I expect for my preventive care visit? Physical exam Your health care provider will check your:  Height and weight. This may be used to calculate body mass index (BMI), which tells if you are at a healthy weight.  Heart rate and blood pressure.  Skin for abnormal spots. Counseling Your health care provider may ask you questions about your:  Alcohol, tobacco, and drug use.  Emotional well-being.  Home and relationship well-being.  Sexual activity.  Eating habits.  Work and work Statistician.  Method of birth control.  Menstrual cycle.  Pregnancy history. What immunizations do I need?  Influenza (flu) vaccine  This is recommended every year. Tetanus, diphtheria, and pertussis (Tdap) vaccine  You may need a Td booster every 10 years. Varicella (chickenpox) vaccine  You may need this if you have not been vaccinated. Zoster (shingles) vaccine  You may need this after age 53. Measles, mumps, and rubella (MMR) vaccine  You may need at least one dose of MMR if you were born in 1957 or later. You may also need a second dose. Pneumococcal  conjugate (PCV13) vaccine  You may need this if you have certain conditions and were not previously vaccinated. Pneumococcal polysaccharide (PPSV23) vaccine  You may need one or two doses if you smoke cigarettes or if you have certain conditions. Meningococcal conjugate (MenACWY) vaccine  You may need this if you have certain conditions. Hepatitis A vaccine  You may need this if you have certain conditions or if you travel or work in places where you may be exposed to hepatitis A. Hepatitis B vaccine  You may need this if you have certain conditions or if you travel or work in places where you may be exposed to hepatitis B. Haemophilus influenzae type b (Hib) vaccine  You may need this if you have certain conditions. Human papillomavirus (HPV) vaccine  If recommended by your health care provider, you may need three doses over 6 months. You may receive vaccines as individual doses or as more than one vaccine together in one shot (combination vaccines). Talk with your health care provider about the risks and benefits of combination vaccines. What tests do I need? Blood tests  Lipid and cholesterol levels. These may be checked every 5 years, or more frequently if you are over 46 years old.  Hepatitis C test.  Hepatitis B test. Screening  Lung cancer screening. You may have this screening every year starting at age 22 if you have a 30-pack-year history of smoking and currently smoke or have quit within the past 15 years.  Colorectal cancer screening. All adults should have this screening starting at age  8 and continuing until age 80. Your health care provider may recommend screening at age 78 if you are at increased risk. You will have tests every 1-10 years, depending on your results and the type of screening test.  Diabetes screening. This is done by checking your blood sugar (glucose) after you have not eaten for a while (fasting). You may have this done every 1-3  years.  Mammogram. This may be done every 1-2 years. Talk with your health care provider about when you should start having regular mammograms. This may depend on whether you have a family history of breast cancer.  BRCA-related cancer screening. This may be done if you have a family history of breast, ovarian, tubal, or peritoneal cancers.  Pelvic exam and Pap test. This may be done every 3 years starting at age 27. Starting at age 78, this may be done every 5 years if you have a Pap test in combination with an HPV test. Other tests  Sexually transmitted disease (STD) testing.  Bone density scan. This is done to screen for osteoporosis. You may have this scan if you are at high risk for osteoporosis. Follow these instructions at home: Eating and drinking  Eat a diet that includes fresh fruits and vegetables, whole grains, lean protein, and low-fat dairy.  Take vitamin and mineral supplements as recommended by your health care provider.  Do not drink alcohol if: ? Your health care provider tells you not to drink. ? You are pregnant, may be pregnant, or are planning to become pregnant.  If you drink alcohol: ? Limit how much you have to 0-1 drink a day. ? Be aware of how much alcohol is in your drink. In the [place] one drink equals one 12 oz bottle of beer (355 mL), one 5 oz glass of wine (148 mL), or one 1 oz glass of hard liquor (44 mL). Lifestyle  Take daily care of your teeth and gums.  Stay active. Exercise for at least 30 minutes on 5 or more days each week.  Do not use any products that contain nicotine or tobacco, such as cigarettes, e-cigarettes, and chewing tobacco. If you need help quitting, ask your health care provider.  If you are sexually active, practice safe sex. Use a condom or other form of birth control (contraception) in order to prevent pregnancy and STIs (sexually transmitted infections).  If told by your health care provider, take low-dose aspirin daily  starting at age 105. What's next?  Visit your health care provider once a year for a well check visit.  Ask your health care provider how often you should have your eyes and teeth checked.  Stay up to date on all vaccines. This information is not intended to replace advice given to you by your health care provider. Make sure you discuss any questions you have with your health care provider. Document Revised: 10/10/2017 Document Reviewed: 10/10/2017 Elsevier Patient Education  [place].    Influenza (Flu) Vaccine (Inactivated or Recombinant): What You Need to Know 1. Why get vaccinated? Influenza vaccine can prevent influenza (flu). Flu is a contagious disease that spreads around the [place] every year, usually between October and May. Anyone can get the flu, but it is more dangerous for some people. Infants and young children, people 54 years of age and older, pregnant women, and people with certain health conditions or a weakened immune system are at greatest risk of flu complications. Pneumonia, bronchitis, sinus infections and ear infections are  examples of flu-related complications. If you have a medical condition, such as heart disease, cancer or diabetes, flu can make it worse. Flu can cause fever and chills, sore throat, muscle aches, fatigue, cough, headache, and runny or stuffy nose. Some people may have vomiting and diarrhea, though this is more common in children than adults. Each year thousands of people in the [place] die from flu, and many more are hospitalized. Flu vaccine prevents millions of illnesses and flu-related visits to the doctor each year. 2. Influenza vaccine CDC recommends everyone 40 months of age and older get vaccinated every flu season. Children 6 months through 94 years of age may need 2 doses during a single flu season. Everyone else needs only 1 dose each flu season. It takes about 2 weeks for protection to develop after vaccination. There  are many flu viruses, and they are always changing. Each year a new flu vaccine is made to protect against three or four viruses that are likely to cause disease in the upcoming flu season. Even when the vaccine doesn't exactly match these viruses, it may still provide some protection. Influenza vaccine does not cause flu. Influenza vaccine may be given at the same time as other vaccines. 3. Talk with your health care provider Tell your vaccine provider if the person getting the vaccine:  Has had an allergic reaction after a previous dose of influenza vaccine, or has any severe, life-threatening allergies.  Has ever had Guillain-Barr Syndrome (also called GBS). In some cases, your health care provider may decide to postpone influenza vaccination to a future visit. People with minor illnesses, such as a cold, may be vaccinated. People who are moderately or severely ill should usually wait until they recover before getting influenza vaccine. Your health care provider can give you more information. 4. Risks of a vaccine reaction  Soreness, redness, and swelling where shot is given, fever, muscle aches, and headache can happen after influenza vaccine.  There may be a very small increased risk of Guillain-Barr Syndrome (GBS) after inactivated influenza vaccine (the flu shot). Young children who get the flu shot along with pneumococcal vaccine (PCV13), and/or DTaP vaccine at the same time might be slightly more likely to have a seizure caused by fever. Tell your health care provider if a child who is getting flu vaccine has ever had a seizure. People sometimes faint after medical procedures, including vaccination. Tell your provider if you feel dizzy or have vision changes or ringing in the ears. As with any medicine, there is a very remote chance of a vaccine causing a severe allergic reaction, other serious injury, or death. 5. What if there is a serious problem? An allergic reaction could occur  after the vaccinated person leaves the clinic. If you see signs of a severe allergic reaction (hives, swelling of the face and throat, difficulty breathing, a fast heartbeat, dizziness, or weakness), call 9-1-1 and get the person to the nearest hospital. For other signs that concern you, call your health care provider. Adverse reactions should be reported to the Vaccine Adverse Event Reporting System (VAERS). Your health care provider will usually file this report, or you can do it yourself. Visit the VAERS website at www.vaers.SamedayNews.es or call (760) 450-2935.VAERS is only for reporting reactions, and VAERS staff do not give medical advice. 6. The National Vaccine Injury Compensation Program The [employer] Vaccine Injury Compensation Program (VICP) is a federal program that was created to compensate people who may have been injured by certain vaccines. Visit  www.hrsa.gov/vaccinecompensation or call 1-800-338-2382 to learn about the program and about filing a claim. There is a time limit to file a claim for compensation. 7. How can I learn more?  Ask your healthcare provider.  Call your local or state health department.  Contact the Centers for Disease Control and Prevention (CDC): ? Call 1-800-232-4636 (1-800-CDC-INFO) or ? Visit CDC's www.cdc.gov/flu Vaccine Information Statement (Interim) Inactivated Influenza Vaccine (09/26/2017) This information is not intended to replace advice given to you by your health care provider. Make sure you discuss any questions you have with your health care provider. Document Revised: 05/20/2018 Document Reviewed: 09/30/2017 Elsevier Patient Education  2020 Elsevier Inc.  

## 2020-01-20 NOTE — Assessment & Plan Note (Signed)
Doing well on Zoloft 100 mg and buspirone 15 mg twice daily.  Continue same.

## 2020-01-20 NOTE — Assessment & Plan Note (Signed)
Immunizations up-to-date.  Influenza vaccine provided today. Pap smear due, offered today but she will have this done at her GYN office. Mammogram due, orders placed. Commended her on healthy lifestyle with regular exercise and healthy diet  Exam today unremarkable. Labs reviewed and also pending for repeat.

## 2020-01-20 NOTE — Assessment & Plan Note (Signed)
No concerns today. Continue to monitor. 

## 2020-01-20 NOTE — Progress Notes (Signed)
Subjective:    Patient ID: Cynthia Diaz, female    DOB: 12-14-1975, 44 y.o.   MRN: [id number]  HPI  This visit occurred during the SARS-CoV-2 public health emergency.  Safety protocols were in place, including screening questions prior to the visit, additional usage of staff PPE, and extensive cleaning of exam room while observing appropriate contact time as indicated for disinfecting solutions.   Cynthia Diaz is a 44 year old female who presents today for complete physical.  She would also like to discuss irregular menses.  She's been experiencing menses every two weeks since October 2021. Overall bleeding is light.  She is questioning whether this pattern of irregular menses occurred secondary to a lot of the events that occurred between July 2021 and now. She stopped breast feeding in July 2021. She started a THRIVE diet in September 2021, lost 10 pounds, is no longer on the diet. She and her family recently recovered from a viral GI bug.  She has had symptoms of fatigue, low back aches when bleeding. Her sister went through menopause at age 53, symptoms started at age 59. She is managed on OCP's for which she's been taking for the last several years while breast feeding, very low dose OCP's.  She denies a family history of uterine fibroids, uterine cancer, cervical cancer, ovarian cancer.  She is due for her Pap smear, follows with GYN and would like to have this done in their office.  Immunizations: -Tetanus: Completed in 2017 -Influenza: Due today -Covid-19: Completed series  Diet: She endorses a healthy diet. Exercise: She is active with working out.   Eye exam: Completes annually  Dental exam: Completes semi-annually   Pap Smear: Due, will get at GYN office Mammogram: Never completed. Due.   BP Readings from Last 3 Encounters:  01/20/20 120/74  09/25/19 118/72  11/19/18 120/70   Wt Readings from Last 3 Encounters:  01/20/20 153 lb (69.4 kg)  09/25/19 169 lb 12 oz (77 kg)   11/19/18 169 lb 12 oz (77 kg)     Review of Systems  Constitutional: Negative for unexpected weight change.  HENT: Negative for rhinorrhea.   Eyes: Negative for visual disturbance.  Respiratory: Negative for cough and shortness of breath.   Cardiovascular: Negative for chest pain.  Gastrointestinal: Negative for constipation and diarrhea.  Genitourinary: Negative for difficulty urinating and menstrual problem.  Musculoskeletal: Negative for arthralgias and myalgias.  Skin: Negative for rash.  Allergic/Immunologic: Negative for environmental allergies.  Neurological: Negative for dizziness and headaches.  Psychiatric/Behavioral: The patient is not nervous/anxious.        Past Medical History:  Diagnosis Date  . Advanced maternal age, 1st pregnancy, unspecified trimester 07/02/2016   [facility] Dating  Blood type:    Genetic Screen 1 Screen:    AFP:     Quad:     NIPS: Antibody:  Anatomic [place]  Rubella:   Varicella: I GTT Early:               Third trimester:  RPR:    Rhogam  HBsAg:    TDaP vaccine                       Flu Shot: HIV:    Baby Food                                GBS:  Contraception  Pap: 06/01/15 ASCUS HPV negative CBB    CS/VBAC   Support Pers  . Alcohol abuse   . Chicken pox   . Elevated blood pressure   . Generalized anxiety disorder   . Kidney stones   . Supervision of high risk pregnancy, antepartum 07/11/2016   [facility] Dating 12 week [place] Blood type: O/Positive/-- (05/21 1523)  Genetic Screen NIPS: XX diploid Antibody:Negative (05/21 1523) Anatomic [place] Normal Rubella: 1.90 (05/21 1523) Varicella: Immune GTT 77 RPR: Non Reactive (09/12 1553)  Rhogam n/a HBsAg: Negative (05/21 1523)  TDaP vaccine 11/1            Flu Shot: 10/10 HIV:   Neg Baby Food Breast                       GBS: Posit  . Vacuum extraction, delivered, current hospitalization 01/21/2017     Social History   Socioeconomic History  . Marital status: Single     Spouse name: Not on file  . Number of children: Not on file  . Years of education: Not on file  . Highest education level: Not on file  Occupational History  . Not on file  Tobacco Use  . Smoking status: Never Smoker  . Smokeless tobacco: Never Used  Vaping Use  . Vaping Use: Never used  Substance and Sexual Activity  . Alcohol use: No    Alcohol/week: 0.0 standard drinks  . Drug use: No  . Sexual activity: Yes    Birth control/protection: Pill  Other Topics Concern  . 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   Enjoys exercising, running, playing soccer, art, gardening.   Social Determinants of Health   Financial Resource Strain:   . Difficulty of Paying Living Expenses: Not on file  Food Insecurity:   . Worried About Programme researcher, broadcasting/film/video in the Last Year: Not on file  . Ran Out of Food in the Last Year: Not on file  Transportation Needs:   . Lack of Transportation (Medical): Not on file  . Lack of Transportation (Non-Medical): Not on file  Physical Activity:   . Days of Exercise per Week: Not on file  . Minutes of Exercise per Session: Not on file  Stress:   . Feeling of Stress : Not on file  Social Connections:   . Frequency of Communication with Friends and Family: Not on file  . Frequency of Social Gatherings with Friends and Family: Not on file  . Attends Religious Services: Not on file  . Active Member of Clubs or Organizations: Not on file  . Attends Banker Meetings: Not on file  . Marital Status: Not on file  Intimate Partner Violence:   . Fear of Current or Ex-Partner: Not on file  . Emotionally Abused: Not on file  . Physically Abused: Not on file  . Sexually Abused: Not on file    Past Surgical History:  Procedure Laterality Date  . COLPOSCOPY  2005  . KNEE SURGERY Left   . WISDOM TOOTH EXTRACTION      Family History  Problem Relation Age of Onset  . Arthritis Father    . Hyperlipidemia Father   . Hypertension Father   . Breast cancer Maternal Aunt     Allergies  Allergen Reactions  . Amoxicillin Hives  . Penicillins Hives    Current Outpatient  Medications on File Prior to Visit  Medication Sig Dispense Refill  . busPIRone (BUSPAR) 10 MG tablet TAKE 1 AND 1/2 TABLET BY MOUTH TWICE DAILY FOR ANXIETY. 270 tablet 0  . LO LOESTRIN FE 1 MG-10 MCG / 10 MCG tablet TAKE 1 TABLET BY MOUTH EVERY DAY 84 tablet 3  . sertraline (ZOLOFT) 100 MG tablet TAKE 1 TABLET BY MOUTH EVERY DAY 90 tablet 0   No current facility-administered medications on file prior to visit.    BP 120/74   Pulse 82   Temp 97.6 F (36.4 C) (Temporal)   Ht 5\' 9"  (1.753 m)   Wt 153 lb (69.4 kg)   SpO2 96%   BMI 22.59 kg/m    Objective:   Physical Exam HENT:     Right Ear: Tympanic membrane and ear canal normal.     Left Ear: Tympanic membrane and ear canal normal.  Eyes:     Pupils: Pupils are equal, round, and reactive to light.  Cardiovascular:     Rate and Rhythm: Normal rate and regular rhythm.  Pulmonary:     Effort: Pulmonary effort is normal.     Breath sounds: Normal breath sounds.  Abdominal:     General: Bowel sounds are normal.     Palpations: Abdomen is soft.     Tenderness: There is no abdominal tenderness.  Musculoskeletal:        General: Normal range of motion.     Cervical back: Neck supple.  Skin:    General: Skin is warm and dry.  Neurological:     Mental Status: She is alert and oriented to person, place, and time.     Cranial Nerves: No cranial nerve deficit.     Deep Tendon Reflexes:     Reflex Scores:      Patellar reflexes are 2+ on the right side and 2+ on the left side. Psychiatric:        Mood and Affect: Mood normal.            Assessment & Plan:

## 2020-01-21 LAB — COMPREHENSIVE METABOLIC PANEL
ALT: 16 U/L (ref 0–35)
AST: 12 U/L (ref 0–37)
Albumin: 4.3 g/dL (ref 3.5–5.2)
Alkaline Phosphatase: 62 U/L (ref 39–117)
BUN: 10 mg/dL (ref 6–23)
CO2: 30 mEq/L (ref 19–32)
Calcium: 9.6 mg/dL (ref 8.4–10.5)
Chloride: 101 mEq/L (ref 96–112)
Creatinine, Ser: 1.04 mg/dL (ref 0.40–1.20)
GFR: 65.46 mL/min (ref >60.00)
Glucose, Bld: 83 mg/dL (ref 70–99)
Potassium: 4 mEq/L (ref 3.5–5.1)
Sodium: 138 mEq/L (ref 135–145)
Total Bilirubin: 0.4 mg/dL (ref 0.2–1.2)
Total Protein: 6.9 g/dL (ref 6.0–8.3)

## 2020-01-21 LAB — CBC
HCT: 42.3 % (ref 36.0–46.0)
Hemoglobin: 14.3 g/dL (ref 12.0–15.0)
MCHC: 33.9 g/dL (ref 30.0–36.0)
MCV: 88.8 fl (ref 78.0–100.0)
Platelets: 307 10*3/uL (ref 150.0–400.0)
RBC: 4.77 Mil/uL (ref 3.87–5.11)
RDW: 12 % (ref 11.5–15.5)
WBC: 6.6 10*3/uL (ref 4.0–10.5)

## 2020-01-21 LAB — TSH: TSH: 2.04 u[IU]/mL (ref 0.35–4.50)

## 2020-01-28 ENCOUNTER — Other Ambulatory Visit: Payer: Self-pay | Admitting: Obstetrics and Gynecology

## 2020-02-04 ENCOUNTER — Other Ambulatory Visit: Payer: Self-pay

## 2020-02-04 ENCOUNTER — Encounter: Payer: Self-pay | Admitting: Obstetrics and Gynecology

## 2020-02-04 ENCOUNTER — Other Ambulatory Visit (HOSPITAL_COMMUNITY)
Admission: RE | Admit: 2020-02-04 | Discharge: 2020-02-04 | Disposition: A | Discharge status: Home / Self Care | Payer: BC Managed Care – PPO | Source: Ambulatory Visit | Attending: Obstetrics and Gynecology | Admitting: Obstetrics and Gynecology

## 2020-02-04 ENCOUNTER — Ambulatory Visit (INDEPENDENT_AMBULATORY_CARE_PROVIDER_SITE_OTHER): Payer: BC Managed Care – PPO | Admitting: Obstetrics and Gynecology

## 2020-02-04 VITALS — BP 100/70 | HR 72 | Ht 69.0 in | Wt 156.0 lb

## 2020-02-04 DIAGNOSIS — Z01419 Encounter for gynecological examination (general) (routine) without abnormal findings: Secondary | ICD-10-CM

## 2020-02-04 DIAGNOSIS — Z124 Encounter for screening for malignant neoplasm of cervix: Secondary | ICD-10-CM | POA: Diagnosis not present

## 2020-02-04 DIAGNOSIS — Z0000 Encounter for general adult medical examination without abnormal findings: Secondary | ICD-10-CM | POA: Diagnosis not present

## 2020-02-04 DIAGNOSIS — Z1231 Encounter for screening mammogram for malignant neoplasm of breast: Secondary | ICD-10-CM | POA: Diagnosis not present

## 2020-02-04 DIAGNOSIS — Z3041 Encounter for surveillance of contraceptive pills: Secondary | ICD-10-CM

## 2020-02-04 MED ORDER — NORETHIN ACE-ETH ESTRAD-FE 1-20 MG-MCG PO TABS: 1.0000 | ORAL_TABLET | Freq: Every day | ORAL | 11 refills | Status: DC

## 2020-02-04 NOTE — Progress Notes (Signed)
Gynecology Annual Exam  PCP: Doreene Nest, NP  Chief Complaint:  Chief Complaint  Patient presents with  . Annual Exam    C/o light period q2wks     History of Present Illness: Patient is a 44 y.o. G1P1001 presents for annual exam. The patient has no complaints today.   LMP: Patient's last menstrual period was 01/21/2020 (approximate). Average Interval:14 days  Duration of flow: several days Heavy Menses: no Intermenstrual Bleeding: no Dysmenorrhea: no  The patient is sexually active. She denies dyspareunia.  Postcoital Bleeding: no She currently uses OCP (estrogen/progesterone) for contraception.    The patient does perform self breast exams.  There is notable family history of breast or ovarian cancer in her family.  The patient denies current symptoms of depression.   Review of Systems: ROS  Past Medical History:  Past Medical History:  Diagnosis Date  . Advanced maternal age, 1st pregnancy, unspecified trimester 07/02/2016   [facility] Dating  Blood type:    Genetic Screen 1 Screen:    AFP:     Quad:     NIPS: Antibody:  Anatomic [place]  Rubella:   Varicella: I GTT Early:               Third trimester:  RPR:    Rhogam  HBsAg:    TDaP vaccine                       Flu Shot: HIV:    Baby Food                                GBS:  Contraception  Pap: 06/01/15 ASCUS HPV negative CBB    CS/VBAC   Support Pers  . Alcohol abuse   . Chicken pox   . Elevated blood pressure   . Generalized anxiety disorder   . Kidney stones   . Supervision of high risk pregnancy, antepartum 07/11/2016   [facility] P[facility] Dating 12 week [place] Blood type: O/Positive/-- (05/21 1523)  Genetic Screen NIPS: XX diploid Antibody:Negative (05/21 1523) Anatomic [place] Normal Rubella: 1.90 (05/21 1523) Varicella: Immune GTT 77 RPR: Non Reactive (09/12 1553)  Rhogam n/a HBsAg: Negative (05/21 1523)  TDaP vaccine 11/1            Flu Shot: 10/10 HIV:   Neg Baby Food Breast                        GBS: Posit  . Vacuum extraction, delivered, current hospitalization 01/21/2017    Past Surgical History:  Past Surgical History:  Procedure Laterality Date  . COLPOSCOPY  2005  . KNEE SURGERY Left   . WISDOM TOOTH EXTRACTION      Gynecologic History:  Patient's last menstrual period was 01/21/2020 (approximate). Contraception: OCP (estrogen/progesterone) Last Pap: Results were: 2017 Last mammogram: never  Obstetric History: G1P1001  Family History:  Family History  Problem Relation Age of Onset  . Arthritis Father   . Hyperlipidemia Father   . Hypertension Father   . Breast cancer Maternal Aunt     Social History:  Social History   Socioeconomic History  . Marital status: Single    Spouse name: Not on file  . Number of children: Not on file  . Years of education: Not on file  . Highest education level: Not on file  Occupational History  .  Not on file  Tobacco Use  . Smoking status: Never Smoker  . Smokeless tobacco: Never Used  Vaping Use  . Vaping Use: Never used  Substance and Sexual Activity  . Alcohol use: No    Alcohol/week: 0.0 standard drinks  . Drug use: No  . Sexual activity: Yes    Birth control/protection: Pill  Other Topics Concern  . 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   Enjoys exercising, running, playing soccer, art, gardening.   Social Determinants of Health   Financial Resource Strain: Not on file  Food Insecurity: Not on file  Transportation Needs: Not on file  Physical Activity: Not on file  Stress: Not on file  Social Connections: Not on file  Intimate Partner Violence: Not on file    Allergies:  Allergies  Allergen Reactions  . Amoxicillin Hives  . Penicillins Hives    Medications: Prior to Admission medications   Medication Sig Start Date End Date Taking? Authorizing Provider  busPIRone (BUSPAR) 10 MG tablet 1 1/2 tab twice a day 01/20/20   Yes Clark, Keane Scrape, NP  LO LOESTRIN FE 1 MG-10 MCG / 10 MCG tablet TAKE 1 TABLET BY MOUTH EVERY DAY 01/28/20  Yes Dawon Troop R, MD  sertraline (ZOLOFT) 100 MG tablet Take 1 tablet (100 mg total) by mouth daily. 01/20/20  Yes Doreene Nest, NP    Physical Exam Vitals: Blood pressure 100/70, pulse 72, height 5\' 9"  (1.753 m), weight 156 lb (70.8 kg), last menstrual period 01/21/2020, currently breastfeeding.  Physical Exam Constitutional:      Appearance: She is well-developed.  Genitourinary:     Vagina and uterus normal.     There is no lesion on the right labia.     There is no lesion on the left labia.    No lesions in the vagina.     Genitourinary Comments: External: Normal appearing vulva. No lesions noted.  Speculum examination: Normal appearing cervix. No blood in the vaginal vault. No discharge.   Bimanual examination: Uterus midline, non-tender, normal in size, shape and contour.  No CMT. No adnexal masses. No adnexal tenderness. Pelvis not fixed.       Right Adnexa: no mass present.    Left Adnexa: no mass present.    No cervical motion tenderness.  Breasts:     Right: No inverted nipple, mass, nipple discharge or skin change.     Left: No inverted nipple, mass, nipple discharge or skin change.    HENT:     Head: Normocephalic and atraumatic.  Eyes:     Extraocular Movements: EOM normal.  Neck:     Thyroid: No thyromegaly.  Cardiovascular:     Rate and Rhythm: Normal rate and regular rhythm.     Heart sounds: Normal heart sounds.  Pulmonary:     Effort: Pulmonary effort is normal.     Breath sounds: Normal breath sounds.  Abdominal:     General: Bowel sounds are normal. There is no distension.     Palpations: Abdomen is soft. There is no mass.  Musculoskeletal:     Cervical back: Neck supple.  Neurological:     Mental Status: She is alert and oriented to person, place, and time.  Skin:    General: Skin is warm and dry.  Psychiatric:         Mood and Affect: Mood and affect normal.  Behavior: Behavior normal.        Thought Content: Thought content normal.        Judgment: Judgment normal.  Vitals reviewed. Exam conducted with a chaperone present.      Female chaperone present for pelvic and breast  portions of the physical exam  Assessment: 44 y.o. G1P1001 routine annual exam  Plan: Problem List Items Addressed This Visit   None   Visit Diagnoses    Health maintenance examination    -  Primary   Encounter for annual routine gynecological examination       Breast cancer screening by mammogram       Cervical cancer screening       Relevant Orders   Cytology - PAP (Completed)   Encounter for birth control pills maintenance       Relevant Medications   norethindrone-ethinyl estradiol (JUNEL FE 1/20) 1-20 MG-MCG tablet   Encounter for gynecological examination without abnormal finding          1) Mammogram - recommend yearly screening mammogram.  Mammogram is ordered, encouraged  2) STI screening was offered and declined  3) ASCCP guidelines and rational discussed.  Patient opts for every 3 years screening interval  4) Contraception - patient having irregular spotting with OCP every 2 weeks. Discussed pelvic US, would like to change to higher dose estrogen OCP. Will follow up if problem persists.  5) Colonoscopy -- start screening at 45.   6) Routine healthcare maintenance including cholesterol, diabetes screening discussed managed by PCP   Adelene Idler MD, [facility], [facility] 02/29/2020 1:43 PM

## 2020-02-04 NOTE — Patient Instructions (Signed)
Institute of Medicine Recommended Dietary Allowances for Calcium and Vitamin D  Age (yr) Calcium Recommended Dietary Allowance (mg/day) Vitamin D Recommended Dietary Allowance (international units/day)  9-18 1,300 600  19-50 1,000 600  51-70 1,200 600  71 and older 1,200 800  Data from Institute of Medicine. Dietary reference intakes: calcium, vitamin D. [place], [place]: National Academies Press; 2011.     Exercising to Stay Healthy To become healthy and stay healthy, it is recommended that you do moderate-intensity and vigorous-intensity exercise. You can tell that you are exercising at a moderate intensity if your heart starts beating faster and you start breathing faster but can still hold a conversation. You can tell that you are exercising at a vigorous intensity if you are breathing much harder and faster and cannot hold a conversation while exercising. Exercising regularly is important. It has many health benefits, such as:  Improving overall fitness, flexibility, and endurance.  Increasing bone density.  Helping with weight control.  Decreasing body fat.  Increasing muscle strength.  Reducing stress and tension.  Improving overall health. How often should I exercise? Choose an activity that you enjoy, and set realistic goals. Your health care provider can help you make an activity plan that works for you. Exercise regularly as told by your health care provider. This may include:  Doing strength training two times a week, such as: ? Lifting weights. ? Using resistance bands. ? Push-ups. ? Sit-ups. ? Yoga.  Doing a certain intensity of exercise for a given amount of time. Choose from these options: ? A total of 150 minutes of moderate-intensity exercise every week. ? A total of 75 minutes of vigorous-intensity exercise every week. ? A mix of moderate-intensity and vigorous-intensity exercise every week. Children, pregnant women, people who have not exercised  regularly, people who are overweight, and older adults may need to talk with a health care provider about what activities are safe to do. If you have a medical condition, be sure to talk with your health care provider before you start a new exercise program. What are some exercise ideas? Moderate-intensity exercise ideas include:  Walking 1 mile (1.6 km) in about 15 minutes.  Biking.  Hiking.  Golfing.  Dancing.  Water aerobics. Vigorous-intensity exercise ideas include:  Walking 4.5 miles (7.2 km) or more in about 1 hour.  Jogging or running 5 miles (8 km) in about 1 hour.  Biking 10 miles (16.1 km) or more in about 1 hour.  Lap swimming.  Roller-skating or in-line skating.  Cross-country skiing.  Vigorous competitive sports, such as football, basketball, and soccer.  Jumping rope.  Aerobic dancing. What are some everyday activities that can help me to get exercise?  Yard work, such as: ? Pushing a lawn mower. ? Raking and bagging leaves.  Washing your car.  Pushing a stroller.  Shoveling snow.  Gardening.  Washing windows or floors. How can I be more active in my day-to-day activities?  Use stairs instead of an elevator.  Take a walk during your lunch break.  If you drive, park your car farther away from your work or school.  If you take public transportation, get off one stop early and walk the rest of the way.  Stand up or walk around during all of your indoor phone calls.  Get up, stretch, and walk around every 30 minutes throughout the day.  Enjoy exercise with a friend. Support to continue exercising will help you keep a regular routine of activity. What guidelines can   I follow while exercising?  Before you start a new exercise program, talk with your health care provider.  Do not exercise so much that you hurt yourself, feel dizzy, or get very short of breath.  Wear comfortable clothes and wear shoes with good support.  Drink plenty of  water while you exercise to prevent dehydration or heat stroke.  Work out until your breathing and your heartbeat get faster. Where to find more information  U.S. Department of Health and Human Services: www.hhs.gov  Centers for Disease Control and Prevention (CDC): www.cdc.gov Summary  Exercising regularly is important. It will improve your overall fitness, flexibility, and endurance.  Regular exercise also will improve your overall health. It can help you control your weight, reduce stress, and improve your bone density.  Do not exercise so much that you hurt yourself, feel dizzy, or get very short of breath.  Before you start a new exercise program, talk with your health care provider. This information is not intended to replace advice given to you by your health care provider. Make sure you discuss any questions you have with your health care provider. Document Revised: 01/11/2017 Document Reviewed: 12/20/2016 Elsevier Patient Education  2020 Elsevier Inc.   Budget-Friendly Healthy Eating There are many ways to save money at the grocery store and continue to eat healthy. You can be successful if you:  Plan meals according to your budget.  Make a grocery list and only purchase food according to your grocery list.  Prepare food yourself. What are tips for following this plan?  Reading food labels  Compare food labels between brand name foods and the store brand. Often the nutritional value is the same, but the store brand is lower cost.  Look for products that do not have added sugar, fat, or salt (sodium). These often cost the same but are healthier for you. Products may be labeled as: ? Sugar-free. ? Nonfat. ? Low-fat. ? Sodium-free. ? Low-sodium.  Look for lean ground beef labeled as at least 92% lean and 8% fat. Shopping  Buy only the items on your grocery list and go only to the areas of the store that have the items on your list.  Use coupons only for foods  and brands you normally buy. Avoid buying items you wouldn't normally buy simply because they are on sale.  Check online and in newspapers for weekly deals.  Buy healthy items from the bulk bins when available, such as herbs, spices, flour, pasta, nuts, and dried fruit.  Buy fruits and vegetables that are in season. Prices are usually lower on in-season produce.  Look at the unit price on the price tag. Use it to compare different brands and sizes to find out which item is the best deal.  Choose healthy items that are often low-cost, such as carrots, potatoes, apples, bananas, and oranges. Dried or canned beans are a low-cost protein source.  Buy in bulk and freeze extra food. Items you can buy in bulk include meats, fish, poultry, frozen fruits, and frozen vegetables.  Avoid buying "ready-to-eat" foods, such as pre-cut fruits and vegetables and pre-made salads.  If possible, shop around to discover where you can find the best prices. Consider other retailers such as dollar stores, larger wholesale stores, local fruit and vegetable stands, and farmers markets.  Do not shop when you are hungry. If you shop while hungry, it may be hard to stick to your list and budget.  Resist impulse buying. Use your grocery list as   your official plan for the week.  Buy a variety of vegetables and fruits by purchasing fresh, frozen, and canned items.  Look at the top and bottom shelves for deals. Foods at eye level (eye level of an adult or child) are usually more expensive.  Be efficient with your time when shopping. The more time you spend at the store, the more money you are likely to spend.  To save money when choosing more expensive foods like meats and dairy: ? Choose cheaper cuts of meat, such as bone-in chicken thighs and drumsticks instead of skinless and boneless chicken. When you are ready to prepare the chicken, you can remove the skin yourself to make it healthier. ? Choose lean meats like  chicken or turkey instead of beef. ? Choose canned seafood, such as tuna, salmon, or sardines. ? Buy eggs as a low-cost source of protein. ? Buy dried beans and peas, such as lentils, split peas, or kidney beans instead of meats. Dried beans and peas are a good alternative source of protein. ? Buy the larger tubs of yogurt instead of individual-sized containers.  Choose water instead of sodas and other sweetened beverages.  Avoid buying chips, cookies, and other "junk food." These items are usually expensive and not healthy. Cooking  Make extra food and freeze the extras in meal-sized containers or in individual portions for fast meals and snacks.  Pre-cook on days when you have extra time to prepare meals in advance. You can keep these meals in the fridge or freezer and reheat for a quick meal.  When you come home from the grocery store, wash, peel, and cut fruits and vegetables so they are ready to use and eat. This will help reduce food waste. Meal planning  Do not eat out or get fast food. Prepare food at home.  Make a grocery list and make sure to bring it with you to the store. If you have a smart phone, you could use your phone to create your shopping list.  Plan meals and snacks according to a grocery list and budget you create.  Use leftovers in your meal plan for the week.  Look for recipes where you can cook once and make enough food for two meals.  Include budget-friendly meals like stews, casseroles, and stir-fry dishes.  Try some meatless meals or try "no cook" meals like salads.  Make sure that half your plate is filled with fruits or vegetables. Choose from fresh, frozen, or canned fruits and vegetables. If eating canned, remember to rinse them before eating. This will remove any excess salt added for packaging. Summary  Eating healthy on a budget is possible if you plan your meals according to your budget, purchase according to your budget and grocery list, and  prepare food yourself.  Tips for buying more food on a limited budget include buying generic brands, using coupons only for foods you normally buy, and buying healthy items from the bulk bins when available.  Tips for buying cheaper food to replace expensive food include choosing cheaper, lean cuts of meat, and buying dried beans and peas. This information is not intended to replace advice given to you by your health care provider. Make sure you discuss any questions you have with your health care provider. Document Revised: 01/30/2017 Document Reviewed: 01/30/2017 Elsevier Patient Education  2020 Elsevier Inc.   Bone Health Bones protect organs, store calcium, anchor muscles, and support the whole body. Keeping your bones strong is important, especially as you   get older. You can take actions to help keep your bones strong and healthy. Why is keeping my bones healthy important?  Keeping your bones healthy is important because your body constantly replaces bone cells. Cells get old, and new cells take their place. As we age, we lose bone cells because the body may not be able to make enough new cells to replace the old cells. The amount of bone cells and bone tissue you have is referred to as bone mass. The higher your bone mass, the stronger your bones. The aging process leads to an overall loss of bone mass in the body, which can increase the likelihood of:  Joint pain and stiffness.  Broken bones.  A condition in which the bones become weak and brittle (osteoporosis). A large decline in bone mass occurs in older adults. In women, it occurs about the time of menopause. What actions can I take to keep my bones healthy? Good health habits are important for maintaining healthy bones. This includes eating nutritious foods and exercising regularly. To have healthy bones, you need to get enough of the right minerals and vitamins. Most nutrition experts recommend getting these nutrients from the  foods that you eat. In some cases, taking supplements may also be recommended. Doing certain types of exercise is also important for bone health. What are the nutritional recommendations for healthy bones?  Eating a well-balanced diet with plenty of calcium and vitamin D will help to protect your bones. Nutritional recommendations vary from person to person. Ask your health care provider what is healthy for you. Here are some general guidelines. Get enough calcium Calcium is the most important (essential) mineral for bone health. Most people can get enough calcium from their diet, but supplements may be recommended for people who are at risk for osteoporosis. Good sources of calcium include:  Dairy products, such as low-fat or nonfat milk, cheese, and yogurt.  Dark green leafy vegetables, such as bok choy and broccoli.  Calcium-fortified foods, such as orange juice, cereal, bread, soy beverages, and tofu products.  Nuts, such as almonds. Follow these recommended amounts for daily calcium intake:  Children, age 1-3: 700 mg.  Children, age 4-8: 1,000 mg.  Children, age 9-13: 1,300 mg.  Teens, age 14-18: 1,300 mg.  Adults, age 19-50: 1,000 mg.  Adults, age 51-70: ? Men: 1,000 mg. ? Women: 1,200 mg.  Adults, age 71 or older: 1,200 mg.  Pregnant and breastfeeding females: ? Teens: 1,300 mg. ? Adults: 1,000 mg. Get enough vitamin D Vitamin D is the most essential vitamin for bone health. It helps the body absorb calcium. Sunlight stimulates the skin to make vitamin D, so be sure to get enough sunlight. If you live in a cold climate or you do not get outside often, your health care provider may recommend that you take vitamin D supplements. Good sources of vitamin D in your diet include:  Egg yolks.  Saltwater fish.  Milk and cereal fortified with vitamin D. Follow these recommended amounts for daily vitamin D intake:  Children and teens, age 1-18: 600 international  units.  Adults, age 50 or younger: 400-800 international units.  Adults, age 51 or older: 800-1,000 international units. Get other important nutrients Other nutrients that are important for bone health include:  Phosphorus. This mineral is found in meat, poultry, dairy foods, nuts, and legumes. The recommended daily intake for adult men and adult women is 700 mg.  Magnesium. This mineral is found in seeds, nuts, dark   green vegetables, and legumes. The recommended daily intake for adult men is 400-420 mg. For adult women, it is 310-320 mg.  Vitamin K. This vitamin is found in green leafy vegetables. The recommended daily intake is 120 mg for adult men and 90 mg for adult women. What type of physical activity is best for building and maintaining healthy bones? Weight-bearing and strength-building activities are important for building and maintaining healthy bones. Weight-bearing activities cause muscles and bones to work against gravity. Strength-building activities increase the strength of the muscles that support bones. Weight-bearing and muscle-building activities include:  Walking and hiking.  Jogging and running.  Dancing.  Gym exercises.  Lifting weights.  Tennis and racquetball.  Climbing stairs.  Aerobics. Adults should get at least 30 minutes of moderate physical activity on most days. Children should get at least 60 minutes of moderate physical activity on most days. Ask your health care provider what type of exercise is best for you. How can I find out if my bone mass is low? Bone mass can be measured with an X-ray test called a bone mineral density (BMD) test. This test is recommended for all women who are age 65 or older. It may also be recommended for:  Men who are age 70 or older.  People who are at risk for osteoporosis because of: ? Having bones that break easily. ? Having a long-term disease that weakens bones, such as kidney disease or rheumatoid  arthritis. ? Having menopause earlier than normal. ? Taking medicine that weakens bones, such as steroids, thyroid hormones, or hormone treatment for breast cancer or prostate cancer. ? Smoking. ? Drinking three or more alcoholic drinks a day. If you find that you have a low bone mass, you may be able to prevent osteoporosis or further bone loss by changing your diet and lifestyle. Where can I find more information? For more information, check out the following websites:  National Osteoporosis Foundation: www.nof.org/patients  National Institutes of Health: www.bones.nih.gov  International Osteoporosis Foundation: www.iofbonehealth.org Summary  The aging process leads to an overall loss of bone mass in the body, which can increase the likelihood of broken bones and osteoporosis.  Eating a well-balanced diet with plenty of calcium and vitamin D will help to protect your bones.  Weight-bearing and strength-building activities are also important for building and maintaining strong bones.  Bone mass can be measured with an X-ray test called a bone mineral density (BMD) test. This information is not intended to replace advice given to you by your health care provider. Make sure you discuss any questions you have with your health care provider. Document Revised: 02/25/2017 Document Reviewed: 02/25/2017 Elsevier Patient Education  2020 Elsevier Inc.   

## 2020-02-10 ENCOUNTER — Telehealth: Payer: BC Managed Care – PPO | Admitting: Family Medicine

## 2020-02-11 ENCOUNTER — Encounter: Payer: Self-pay | Admitting: Family Medicine

## 2020-02-11 ENCOUNTER — Other Ambulatory Visit: Payer: Self-pay

## 2020-02-11 ENCOUNTER — Telehealth (INDEPENDENT_AMBULATORY_CARE_PROVIDER_SITE_OTHER): Payer: BC Managed Care – PPO | Admitting: Family Medicine

## 2020-02-11 VITALS — Ht 69.0 in | Wt 150.0 lb

## 2020-02-11 DIAGNOSIS — J018 Other acute sinusitis: Secondary | ICD-10-CM

## 2020-02-11 LAB — CYTOLOGY - PAP
Comment: NEGATIVE
Diagnosis: NEGATIVE
High risk HPV: NEGATIVE

## 2020-02-11 MED ORDER — DOXYCYCLINE HYCLATE 100 MG PO TABS: 100.0000 mg | ORAL_TABLET | Freq: Two times a day (BID) | ORAL | 0 refills | Status: AC

## 2020-02-11 NOTE — Progress Notes (Signed)
    I connected with Cynthia Diaz on 02/11/20 at 11:40 AM EST by video and verified that I am speaking with the correct person using two identifiers.   I discussed the limitations, risks, security and privacy concerns of performing an evaluation and management service by video and the availability of in person appointments. I also discussed with the patient that there may be a patient responsible charge related to this service. The patient expressed understanding and agreed to proceed.  Patient location: Home Provider Location: [facility] Participants: Cynthia Diaz and Cynthia Diaz   Subjective:     Cynthia Diaz is a 44 y.o. female presenting for Sore Throat (Pt stated that she has had some Congestion, cough for the past 2 weeks)     Sore Throat  This is a new problem. The current episode started 1 to 4 weeks ago. The maximum temperature recorded prior to her arrival was 100.4 - 100.9 F. The pain is mild. Associated symptoms include congestion and coughing. She has tried acetaminophen (nyquil) for the symptoms.   Continued to have annoying tickle in throat with cough Fever has resolved Constant sore throat Moves side to side in the throat  Was feeling better 3 days ago. Went for a run and her throat started bothering her again.   Yesterday found an antibiotic for her daughter - azithromycin and she started this prescription - she took this   Daughter started preschool in September. She had a cold 3 weeks ago - cough congestion, fever, runny nose - motrin and natural medicine  Treatment: Delsym and pseudoephedrine  Review of Systems  HENT: Positive for congestion, sinus pressure and sinus pain.   Respiratory: Positive for cough.      Social History   Tobacco Use  Smoking Status Never Smoker  Smokeless Tobacco Never Used        Objective:   BP Readings from Last 3 Encounters:  02/04/20 100/70  01/20/20 120/74  09/25/19 118/72   Wt  Readings from Last 3 Encounters:  02/11/20 150 lb (68 kg)  02/04/20 156 lb (70.8 kg)  01/20/20 153 lb (69.4 kg)    Ht 5\' 9"  (1.753 m)   Wt 150 lb (68 kg)   LMP 01/21/2020 (Approximate)   Breastfeeding No   BMI 22.15 kg/m    Physical Exam Speaking clearly in complete sentences Alert and oriented        Assessment & Plan:   Problem List Items Addressed This Visit   None   Visit Diagnoses    Acute non-recurrent sinusitis of other sinus    -  Primary   Relevant Medications   doxycycline (VIBRA-TABS) 100 MG tablet     Interactive audio and video telecommunications were attempted between this provider and patient, however failed, due to patient having technical difficulties OR patient did not have access to video capability.  We continued and completed visit with audio only.   Start Time: 10:57 End Time: 11:09   Return if symptoms worsen or fail to improve.  14/10/2019, MD

## 2020-02-15 ENCOUNTER — Telehealth: Payer: BC Managed Care – PPO | Admitting: Internal Medicine

## 2020-04-15 ENCOUNTER — Other Ambulatory Visit: Payer: Self-pay

## 2020-04-15 ENCOUNTER — Ambulatory Visit
Admission: RE | Admit: 2020-04-15 | Discharge: 2020-04-15 | Disposition: A | Discharge status: Home / Self Care | Payer: BC Managed Care – PPO | Source: Ambulatory Visit | Attending: Primary Care | Admitting: Primary Care

## 2020-04-15 DIAGNOSIS — Z1231 Encounter for screening mammogram for malignant neoplasm of breast: Secondary | ICD-10-CM | POA: Insufficient documentation

## 2020-04-15 HISTORY — DX: Diffuse cystic mastopathy of unspecified breast: N60.19

## 2020-12-24 ENCOUNTER — Other Ambulatory Visit: Payer: Self-pay | Admitting: Primary Care

## 2020-12-24 DIAGNOSIS — F411 Generalized anxiety disorder: Secondary | ICD-10-CM

## 2021-01-10 ENCOUNTER — Other Ambulatory Visit: Payer: Self-pay | Admitting: Obstetrics and Gynecology

## 2021-01-10 DIAGNOSIS — Z3041 Encounter for surveillance of contraceptive pills: Secondary | ICD-10-CM

## 2021-01-18 ENCOUNTER — Other Ambulatory Visit: Payer: Self-pay | Admitting: Primary Care

## 2021-01-18 DIAGNOSIS — F411 Generalized anxiety disorder: Secondary | ICD-10-CM

## 2021-02-17 ENCOUNTER — Other Ambulatory Visit: Payer: Self-pay | Admitting: Primary Care

## 2021-02-17 DIAGNOSIS — F411 Generalized anxiety disorder: Secondary | ICD-10-CM

## 2021-03-14 ENCOUNTER — Other Ambulatory Visit: Payer: Self-pay | Admitting: Primary Care

## 2021-03-14 DIAGNOSIS — F411 Generalized anxiety disorder: Secondary | ICD-10-CM

## 2021-03-22 ENCOUNTER — Encounter: Payer: BC Managed Care – PPO | Admitting: Primary Care

## 2021-03-22 NOTE — Telephone Encounter (Signed)
Pt was given care advice from access nurse and pt already has VV with Dr Selena Batten scheduled for 03/23/21 at 8:40. Sending note to Allayne Gitelman NP as Lorain Childes to PCP and Dr Selena Batten and Neysa Bonito CMA and will teams [place].

## 2021-03-22 NOTE — Telephone Encounter (Signed)
[place] Night - Client TELEPHONE ADVICE RECORD [facility] Patient Name: Cynthia Diaz Gender: Female DOB: 03/24/1958 Age: 46 Y 11 M 29 D Return Phone Number: [phone] (Primary) Address: City/ State/ Zip: [place] [address] Client [place] Night - Client Client Site [place] - Night Provider Renford Dills - MD Contact Type Call Who Is Calling Patient / Member / Family / Caregiver Call Type Triage / Clinical Relationship To Patient Self Return Phone Number [phone] (Primary) Chief Complaint Fever (non-urgent symptom) (greater than THREE MONTHS old) Reason for Call Symptomatic / Request for [place] states she has been having sore throat and went to the clinic and was given prednisone. Caller states she started getting better then turn for the worst and she is now having a fever temp of 100.5 Translation No Nurse Assessment Nurse: D'[place] Lucia Gaskins, RN, Adrienne Date/Time (Eastern Time): 03/22/2021 9:22:07 AM Confirm and document reason for call. If symptomatic, describe symptoms. ---Caller states she has been having sore throat since January 15th and went to the [facility] on 1/19 and was given prednisone and abx. Caller states she started getting better then took a turn for the worse and she is now having a fever temp of 100.5 oral. Her ears also hurt, mostly the right side. She also has white spots on the back of her throat. Does the patient have any new or worsening symptoms? ---Yes Will a triage be completed? ---Yes Related visit to physician within the last 2 weeks? ---No Does the PT have any chronic conditions? (i.e. diabetes, asthma, this includes High risk factors for pregnancy, etc.) ---No Is this a behavioral health or substance abuse call? ---No Guidelines Guideline Title Affirmed Question Affirmed Notes Nurse Date/Time  Eilene Ghazi Time) Sore Throat Earache also present [place], RN, Adrienne 03/22/2021 9:28:02 AM Disp. Time Eilene Ghazi Time) Disposition Final User PLEASE NOTE: All timestamps contained within this report are represented as [place] Standard Time. CONFIDENTIALTY NOTICE: This fax transmission is intended only for the addressee. It contains information that is legally privileged, confidential or otherwise protected from use or disclosure. If you are not the intended recipient, you are strictly prohibited from reviewing, disclosing, copying using or disseminating any of this information or taking any action in reliance on or regarding this information. If you have received this fax in error, please notify us immediately by telephone so that we can arrange for its return to [place]. Phone: [phone], Toll-Free: [phone], Fax: [phone] Page: 2 of 2 Call Id: [id number] 03/22/2021 9:31:34 AM See PCP within 24 Hours Yes [place], RN, Vincente Liberty Caller Disagree/Comply Comply Caller Understands Yes PreDisposition Call Doctor Care Advice Given Per Guideline SEE PCP WITHIN 24 HOURS: * IF OFFICE WILL BE OPEN: You need to be examined within the next 24 hours. Call your doctor (or NP/PA) when the office opens and make an appointment. SORE THROAT: * Sip warm chicken broth or apple juice. * Gargle with warm salt water four times a day. To make salt water, put 1/2 teaspoon of salt in 8 oz (240 ml) of warm water. PAIN AND FEVER MEDICINES: * For pain or fever relief, take either acetaminophen or ibuprofen. * Cold drinks, popsicles, and milk shakes are especially good. Avoid citrus fruits. CALL BACK IF: * You become worse CARE ADVICE given per Sore Throat (Adult) guideline. Referrals REFERRED TO PCP OFFIC

## 2021-03-22 NOTE — Telephone Encounter (Signed)
Noted. Appreciate Dr. Elmyra Ricks evaluation.

## 2021-03-23 ENCOUNTER — Telehealth (INDEPENDENT_AMBULATORY_CARE_PROVIDER_SITE_OTHER): Payer: BC Managed Care – PPO | Admitting: Family Medicine

## 2021-03-23 ENCOUNTER — Encounter: Payer: Self-pay | Admitting: Family Medicine

## 2021-03-23 VITALS — Temp 100.5°F | Wt 155.0 lb

## 2021-03-23 DIAGNOSIS — U071 COVID-19: Secondary | ICD-10-CM | POA: Diagnosis not present

## 2021-03-23 NOTE — Patient Instructions (Signed)
Do not take more than 2400 mg of ibuprofen per day  Can take up to 4000 mg of Tylenol but would advise keeping it below 3000 mg  Call if still fevers in 2 days  ER if severe breathing issues or sudden chest pain

## 2021-03-23 NOTE — Progress Notes (Signed)
° ° °  I connected with Cynthia Diaz on 03/23/21 at  8:40 AM EST by video and verified that I am speaking with the correct person using two identifiers.   I discussed the limitations, risks, security and privacy concerns of performing an evaluation and management service by video and the availability of in person appointments. I also discussed with the patient that there may be a patient responsible charge related to this service. The patient expressed understanding and agreed to proceed.  Patient location: Home Provider Location: [place] Participants: Cynthia Diaz and Cynthia Diaz   Subjective:     Cynthia Diaz is a 46 y.o. female presenting for Covid Positive (Onset of Sx: 03/21/21), Generalized Body Aches, Cough, Nasal Congestion, Fever (100-101 oral ), and Fatigue     Cough Associated symptoms include a fever.  Fever  Associated symptoms include coughing.   #Covid - 03/21/2021 - body aches - cough - congestion - fever 100-101 and fatigue - back, legs, body aches - having trouble sleeping - advil cold and sinus and ibuprofen 400 mg every 4 hours - couldn't sleep due to achiness - slightly better  - daughter and in-laws recently had covid   Review of Systems  Constitutional:  Positive for fever.  Respiratory:  Positive for cough.     Social History   Tobacco Use  Smoking Status Never  Smokeless Tobacco Never        Objective:   BP Readings from Last 3 Encounters:  02/04/20 100/70  01/20/20 120/74  09/25/19 118/72   Wt Readings from Last 3 Encounters:  03/23/21 155 lb (70.3 kg)  02/11/20 150 lb (68 kg)  02/04/20 156 lb (70.8 kg)   Temp (!) 100.5 F (38.1 C) (Oral)    Wt 155 lb (70.3 kg)    LMP 03/19/2021 (Exact Date)    BMI 22.89 kg/m   Physical Exam Constitutional:      Appearance: Normal appearance. She is not ill-appearing.  HENT:     Head: Normocephalic and atraumatic.     Right Ear: External ear normal.     Left  Ear: External ear normal.  Eyes:     Conjunctiva/sclera: Conjunctivae normal.  Pulmonary:     Effort: Pulmonary effort is normal. No respiratory distress.  Neurological:     Mental Status: She is alert. Mental status is at baseline.  Psychiatric:        Mood and Affect: Mood normal.        Behavior: Behavior normal.        Thought Content: Thought content normal.        Judgment: Judgment normal.          Assessment & Plan:   Problem List Items Addressed This Visit   None Visit Diagnoses     COVID-19 virus infection    -  Primary      Discussed no risk factors for severe covid and not a candidate for medication Advised limiting ibuprofen to <2400 mg per day  Discussed adding Tylenol up to 4000 mg per day No breathing issues or signs of dehydration ER precautions  Offered flu testing but given exposures and already >48 hours would likely not change management  F/u in 2 days if still having fevers   Return if symptoms worsen or fail to improve.  Cynthia Child, MD

## 2021-03-23 NOTE — Telephone Encounter (Signed)
See note from today

## 2021-03-24 ENCOUNTER — Encounter: Payer: BC Managed Care – PPO | Admitting: Primary Care

## 2021-04-18 ENCOUNTER — Other Ambulatory Visit: Payer: Self-pay | Admitting: Primary Care

## 2021-04-18 DIAGNOSIS — F411 Generalized anxiety disorder: Secondary | ICD-10-CM

## 2021-04-18 NOTE — Telephone Encounter (Signed)
Patient is significantly overdue for CPE/follow up. ?Please schedule ASAP. ?Let me know when this has been done. ?

## 2021-04-19 NOTE — Telephone Encounter (Signed)
Mail box full. Sent my chart message to patient to call office.  ?

## 2021-04-21 NOTE — Telephone Encounter (Signed)
Called mail box full

## 2021-04-24 NOTE — Telephone Encounter (Signed)
Noted. Refill(s) sent to pharmacy.  

## 2021-04-24 NOTE — Telephone Encounter (Signed)
Looks like patient has called and set up an appointment with you on 3/30 ?

## 2021-05-11 ENCOUNTER — Encounter: Payer: BC Managed Care – PPO | Admitting: Primary Care

## 2021-05-21 ENCOUNTER — Other Ambulatory Visit: Payer: Self-pay | Admitting: Primary Care

## 2021-05-21 DIAGNOSIS — F411 Generalized anxiety disorder: Secondary | ICD-10-CM

## 2021-05-24 ENCOUNTER — Ambulatory Visit (INDEPENDENT_AMBULATORY_CARE_PROVIDER_SITE_OTHER): Payer: BC Managed Care – PPO | Admitting: Primary Care

## 2021-05-24 ENCOUNTER — Telehealth: Payer: Self-pay

## 2021-05-24 ENCOUNTER — Encounter: Payer: Self-pay | Admitting: Primary Care

## 2021-05-24 VITALS — BP 118/80 | HR 67 | Temp 97.2°F | Ht 69.0 in | Wt 155.0 lb

## 2021-05-24 DIAGNOSIS — Z1231 Encounter for screening mammogram for malignant neoplasm of breast: Secondary | ICD-10-CM

## 2021-05-24 DIAGNOSIS — Z0000 Encounter for general adult medical examination without abnormal findings: Secondary | ICD-10-CM

## 2021-05-24 DIAGNOSIS — G4700 Insomnia, unspecified: Secondary | ICD-10-CM

## 2021-05-24 DIAGNOSIS — F411 Generalized anxiety disorder: Secondary | ICD-10-CM | POA: Diagnosis not present

## 2021-05-24 DIAGNOSIS — Z1211 Encounter for screening for malignant neoplasm of colon: Secondary | ICD-10-CM

## 2021-05-24 LAB — COMPREHENSIVE METABOLIC PANEL
ALT: 9 U/L (ref 0–35)
AST: 12 U/L (ref 0–37)
Albumin: 4.5 g/dL (ref 3.5–5.2)
Alkaline Phosphatase: 58 U/L (ref 39–117)
BUN: 12 mg/dL (ref 6–23)
CO2: 29 mEq/L (ref 19–32)
Calcium: 9.7 mg/dL (ref 8.4–10.5)
Chloride: 101 mEq/L (ref 96–112)
Creatinine, Ser: 0.89 mg/dL (ref 0.40–1.20)
GFR: 78.17 mL/min (ref >60.00)
Glucose, Bld: 83 mg/dL (ref 70–99)
Potassium: 4.4 mEq/L (ref 3.5–5.1)
Sodium: 138 mEq/L (ref 135–145)
Total Bilirubin: 0.6 mg/dL (ref 0.2–1.2)
Total Protein: 6.7 g/dL (ref 6.0–8.3)

## 2021-05-24 LAB — LIPID PANEL
Cholesterol: 170 mg/dL (ref 0–200)
HDL: 72.3 mg/dL (ref >39.00)
LDL Cholesterol: 85 mg/dL (ref 0–99)
NonHDL: 97.74
Total CHOL/HDL Ratio: 2
Triglycerides: 64 mg/dL (ref 0.0–149.0)
VLDL: 12.8 mg/dL (ref 0.0–40.0)

## 2021-05-24 LAB — CBC
HCT: 39.2 % (ref 36.0–46.0)
Hemoglobin: 13.3 g/dL (ref 12.0–15.0)
MCHC: 33.9 g/dL (ref 30.0–36.0)
MCV: 91.2 fl (ref 78.0–100.0)
Platelets: 270 10*3/uL (ref 150.0–400.0)
RBC: 4.3 Mil/uL (ref 3.87–5.11)
RDW: 13 % (ref 11.5–15.5)
WBC: 4.8 10*3/uL (ref 4.0–10.5)

## 2021-05-24 LAB — TSH: TSH: 1.47 u[IU]/mL (ref 0.35–5.50)

## 2021-05-24 LAB — VITAMIN D 25 HYDROXY (VIT D DEFICIENCY, FRACTURES): VITD: 32.58 ng/mL (ref 30.00–100.00)

## 2021-05-24 LAB — VITAMIN B12: Vitamin B-12: 248 pg/mL (ref 211–911)

## 2021-05-24 NOTE — Assessment & Plan Note (Addendum)
Immunizations UTD. ?Pap smear UTD. ?Mammogram due, orders placed.  ?Colonoscopy due, referral placed.  ? ?Commended her on a healthy diet and exercise.  ? ?Exam today stable. ?Labs pending. ?

## 2021-05-24 NOTE — Assessment & Plan Note (Signed)
Controlled. ? ?Continue Buspar 15 mg BID which helps with sleep.  ? ?Continue to monitor.  ?

## 2021-05-24 NOTE — Assessment & Plan Note (Signed)
Controlled.  ? ?Continue sertraline 100 mg daily, buspirone 15 mg BID. ? ?Continue to monitor.  ?

## 2021-05-24 NOTE — Telephone Encounter (Signed)
Attempted to contact patient regarding screening colonoscopy referral.  Unable to lvm due to voice mail is currently full.  I will send her a  mychart message to notify of referral received. ? ?Thanks, ?Sharyn Lull, CMA ?

## 2021-05-24 NOTE — Patient Instructions (Addendum)
Stop by the lab prior to leaving today. I will notify you of your results once received.  ° °You will be contacted regarding your referral to GI for the colonoscopy.  Please let us know if you have not been contacted within two weeks.  ° °It was a pleasure to see you today! ° °Preventive Care 40-46 Years Old, Female °Preventive care refers to lifestyle choices and visits with your health care provider that can promote health and wellness. Preventive care visits are also called wellness exams. °What can I expect for my preventive care visit? °Counseling °Your health care provider may ask you questions about your: °Medical history, including: °Past medical problems. °Family medical history. °Pregnancy history. °Current health, including: °Menstrual cycle. °Method of birth control. °Emotional well-being. °Home life and relationship well-being. °Sexual activity and sexual health. °Lifestyle, including: °Alcohol, nicotine or tobacco, and drug use. °Access to firearms. °Diet, exercise, and sleep habits. °Work and work environment. °Sunscreen use. °Safety issues such as seatbelt and bike helmet use. °Physical exam °Your health care provider will check your: °Height and weight. These may be used to calculate your BMI (body mass index). BMI is a measurement that tells if you are at a healthy weight. °Waist circumference. This measures the distance around your waistline. This measurement also tells if you are at a healthy weight and may help predict your risk of certain diseases, such as type 2 diabetes and high blood pressure. °Heart rate and blood pressure. °Body temperature. °Skin for abnormal spots. °What immunizations do I need? °Vaccines are usually given at various ages, according to a schedule. Your health care provider will recommend vaccines for you based on your age, medical history, and lifestyle or other factors, such as travel or where you work. °What tests do I need? °Screening °Your health care provider may  recommend screening tests for certain conditions. This may include: °Lipid and cholesterol levels. °Diabetes screening. This is done by checking your blood sugar (glucose) after you have not eaten for a while (fasting). °Pelvic exam and Pap test. °Hepatitis B test. °Hepatitis C test. °HIV (human immunodeficiency virus) test. °STI (sexually transmitted infection) testing, if you are at risk. °Lung cancer screening. °Colorectal cancer screening. °Mammogram. Talk with your health care provider about when you should start having regular mammograms. This may depend on whether you have a family history of breast cancer. °BRCA-related cancer screening. This may be done if you have a family history of breast, ovarian, tubal, or peritoneal cancers. °Bone density scan. This is done to screen for osteoporosis. °Talk with your health care provider about your test results, treatment options, and if necessary, the need for more tests. °Follow these instructions at home: °Eating and drinking ° °Eat a diet that includes fresh fruits and vegetables, whole grains, lean protein, and low-fat dairy products. °Take vitamin and mineral supplements as recommended by your health care provider. °Do not drink alcohol if: °Your health care provider tells you not to drink. °You are pregnant, may be pregnant, or are planning to become pregnant. °If you drink alcohol: °Limit how much you have to 0-1 drink a day. °Know how much alcohol is in your drink. In the [place] one drink equals one 12 oz bottle of beer (355 mL), one 5 oz glass of wine (148 mL), or one 1½ oz glass of hard liquor (44 mL). °Lifestyle °Brush your teeth every morning and night with fluoride toothpaste. Floss one time each day. °Exercise for at least 30 minutes 5 or more days   each week. ?Do not use any products that contain nicotine or tobacco. These products include cigarettes, chewing tobacco, and vaping devices, such as e-cigarettes. If you need help quitting, ask your health  care provider. ?Do not use drugs. ?If you are sexually active, practice safe sex. Use a condom or other form of protection to prevent STIs. ?If you do not wish to become pregnant, use a form of birth control. If you plan to become pregnant, see your health care provider for a prepregnancy visit. ?Take aspirin only as told by your health care provider. Make sure that you understand how much to take and what form to take. Work with your health care provider to find out whether it is safe and beneficial for you to take aspirin daily. ?Find healthy ways to manage stress, such as: ?Meditation, yoga, or listening to music. ?Journaling. ?Talking to a trusted person. ?Spending time with friends and family. ?Minimize exposure to UV radiation to reduce your risk of skin cancer. ?Safety ?Always wear your seat belt while driving or riding in a vehicle. ?Do not drive: ?If you have been drinking alcohol. Do not ride with someone who has been drinking. ?When you are tired or distracted. ?While texting. ?If you have been using any mind-altering substances or drugs. ?Wear a helmet and other protective equipment during sports activities. ?If you have firearms in your house, make sure you follow all gun safety procedures. ?Seek help if you have been physically or sexually abused. ?What's next? ?Visit your health care provider once a year for an annual wellness visit. ?Ask your health care provider how often you should have your eyes and teeth checked. ?Stay up to date on all vaccines. ?This information is not intended to replace advice given to you by your health care provider. Make sure you discuss any questions you have with your health care provider. ?Document Revised: 07/27/2020 Document Reviewed: 07/27/2020 ?Elsevier Patient Education ? [place]. ? ?

## 2021-05-24 NOTE — Progress Notes (Signed)
? ? ? ? Patient ID: Cynthia ParisianKatharine S Diaz, female    DOB: 1976-01-11, 46 y.o.   MRN: [id number] ? ?HPI ? ?Cynthia ParisianKatharine S Diaz is a very pleasant 46 y.o. female who presents today for complete physical and follow up of chronic conditions. ? ? ?Immunizations: ?-Tetanus: 2017 ?-Influenza: Completed last season ?-Covid-19: 2 vaccine ? ?Diet: Healthy diet.  ?Exercise: No regular exercise. ? ?Eye exam: Completes annually  ?Dental exam: Completes semi-annually  ? ?Pap Smear: Completed in 2021 ?Mammogram: Completed in March 2022 ?Colonoscopy:  Never completed ? ? ?BP Readings from Last 3 Encounters:  ?05/24/21 118/80  ?02/04/20 100/70  ?01/20/20 120/74  ? ? ?  ? ? ? ? ?Review of Systems  ?Constitutional:  Negative for unexpected weight change.  ?HENT:  Negative for rhinorrhea.   ?Eyes:  Negative for visual disturbance.  ?Respiratory:  Negative for cough and shortness of breath.   ?Cardiovascular:  Negative for chest pain.  ?Gastrointestinal:  Negative for constipation and diarrhea.  ?Genitourinary:  Negative for difficulty urinating.  ?Musculoskeletal:  Negative for arthralgias and myalgias.  ?Skin:  Negative for rash.  ?Allergic/Immunologic: Negative for environmental allergies.  ?Neurological:  Negative for dizziness and headaches.  ?Psychiatric/Behavioral:  The patient is not nervous/anxious.   ? ?   ? ? ?Past Medical History:  ?Diagnosis Date  ? Advanced maternal age, 1st pregnancy, unspecified trimester 07/02/2016  ? [facility] Dating  Blood type:    Genetic Screen 1? Screen:    AFP:     Quad:     NIPS: Antibody:  Anatomic US  Rubella:   Varicella: I GTT Early:               Third trimester:  RPR:    Rhogam  HBsAg:    TDaP vaccine                       Flu Shot: HIV:    Baby Food                                GBS:  Contraception  Pap: 06/01/15 ASCUS HPV negative CBB    CS/VBAC   Support Pers  ? Alcohol abuse   ? Chicken pox   ? Elevated blood pressure   ? Fibrocystic breast   ? Generalized anxiety disorder    ? Kidney stones   ? Supervision of high risk pregnancy, antepartum 07/11/2016  ? [facility] Dating 12 week US Blood type: O/Positive/-- (05/21 1523)  Genetic Screen NIPS: XX diploid Antibody:Negative (05/21 1523) Anatomic US Normal Rubella: 1.90 (05/21 1523) Varicella: Immune GTT 77 RPR: Non Reactive (09/12 1553)  Rhogam n/a HBsAg: Negative (05/21 1523)  TDaP vaccine 11/1            Flu Shot: 10/10 HIV:   Neg Baby Food Breast                       GBS: Posit  ? Vacuum extraction, delivered, current hospitalization 01/21/2017  ? ? ?Social History  ? ?Socioeconomic History  ? Marital status: Married  ?  Spouse name: Not on file  ? Number of children: Not on file  ? Years of education: Not on file  ? Highest education level: Not on file  ?Occupational History  ? Not on file  ?Tobacco Use  ? Smoking status: Never  ? Smokeless tobacco:  Never  ?Vaping Use  ? Vaping Use: Never used  ?Substance and Sexual Activity  ? Alcohol use: No  ?  Alcohol/week: 0.0 standard drinks  ? Drug use: No  ? Sexual activity: Yes  ?  Birth control/protection: Pill  ?Other Topics Concern  ? Not on file  ?Social History Narrative  ? Engaged to be married.  ? 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.  ? No children.  ? Enjoys exercising, running, playing soccer, art, gardening.  ? ?Social Determinants of Health  ? ?Financial Resource Strain: Not on file  ?Food Insecurity: Not on file  ?Transportation Needs: Not on file  ?Physical Activity: Not on file  ?Stress: Not on file  ?Social Connections: Not on file  ?Intimate Partner Violence: Not on file  ? ? ?Past Surgical History:  ?Procedure Laterality Date  ? COLPOSCOPY  2005  ? KNEE SURGERY Left   ? WISDOM TOOTH EXTRACTION    ? ? ?Family History  ?Problem Relation Age of Onset  ? Arthritis Father   ? Hyperlipidemia Father   ? Hypertension Father   ? Breast cancer Maternal Aunt   ? ? ?Allergies  ?Allergen Reactions  ? Amoxicillin Hives  ? Penicillins Hives  ? ? ?Current Outpatient Medications on  File Prior to Visit  ?Medication Sig Dispense Refill  ? busPIRone (BUSPAR) 10 MG tablet TAKE 1 AND 1/2 TABLETS BY MOUTH TWICE A DAY FOR ANXIETY. OFFICE VISIT REQUIRED FOR FURTHER REFILLS 90 tablet 0  ? sertraline (ZOLOFT) 100 MG tablet TAKE 1 TABLET (100 MG TOTAL) BY MOUTH DAILY. FOR ANXIETY. OFFICE VISIT REQUIRED FOR FURTHER REFILLS. 30 tablet 0  ? ?No current facility-administered medications on file prior to visit.  ? ? ?BP 118/80 (BP Location: Left Arm, Patient Position: Sitting, Cuff Size: Normal)   Pulse 67   Temp (!) 97.2 ?F (36.2 ?C) (Temporal)   Ht 5\' 9"  (1.753 m)   Wt 155 lb (70.3 kg)   SpO2 99%   BMI 22.89 kg/m?  ?Objective:  ? Physical Exam ?HENT:  ?   Right Ear: Tympanic membrane and ear canal normal.  ?   Left Ear: Tympanic membrane and ear canal normal.  ?   Nose: Nose normal.  ?Eyes:  ?   Conjunctiva/sclera: Conjunctivae normal.  ?   Pupils: Pupils are equal, round, and reactive to light.  ?Neck:  ?   Thyroid: No thyromegaly.  ?Cardiovascular:  ?   Rate and Rhythm: Normal rate and regular rhythm.  ?   Heart sounds: No murmur heard. ?Pulmonary:  ?   Effort: Pulmonary effort is normal.  ?   Breath sounds: Normal breath sounds. No rales.  ?Abdominal:  ?   General: Bowel sounds are normal.  ?   Palpations: Abdomen is soft.  ?   Tenderness: There is no abdominal tenderness.  ?Musculoskeletal:     ?   General: Normal range of motion.  ?   Cervical back: Neck supple.  ?Lymphadenopathy:  ?   Cervical: No cervical adenopathy.  ?Skin: ?   General: Skin is warm and dry.  ?   Findings: No rash.  ?Neurological:  ?   Mental Status: She is alert and oriented to person, place, and time.  ?   Cranial Nerves: No cranial nerve deficit.  ?   Deep Tendon Reflexes: Reflexes are normal and symmetric.  ?Psychiatric:     ?   Mood and Affect: Mood normal.  ? ? ? ? ? ?   ?Assessment & Plan:  ? ? ? ? ?  This visit occurred during the SARS-CoV-2 public health emergency.  Safety protocols were in place, including screening  questions prior to the visit, additional usage of staff PPE, and extensive cleaning of exam room while observing appropriate contact time as indicated for disinfecting solutions.  ?

## 2021-05-26 ENCOUNTER — Telehealth: Payer: Self-pay

## 2021-05-26 NOTE — Telephone Encounter (Signed)
CALLED PATIENT NO ANSWER LEFT VOICEMAIL FOR A CALL BACK ? ?

## 2021-05-31 ENCOUNTER — Telehealth: Payer: Self-pay

## 2021-05-31 NOTE — Telephone Encounter (Signed)
CALLED PATIENT NO ANSWER LEFT VOICEMAIL FOR A CALL BACK °Letter sent °

## 2021-10-25 NOTE — Telephone Encounter (Signed)
Joellen/Kate,   Can we get her cervical spine xrays burned onto a disc? Will you notify patient when ready for pick up?  Mayra Reel, NP-C

## 2021-12-28 IMAGING — MG MM DIGITAL SCREENING BILAT W/ TOMO AND CAD
6 of 10 series · 6 of 30 positions shown · non-contrast
Comparison: None.

CLINICAL DATA: Screening.

EXAM:
DIGITAL SCREENING BILATERAL MAMMOGRAM WITH TOMOSYNTHESIS AND CAD
TECHNIQUE: Bilateral screening digital craniocaudal and mediolateral oblique
mammograms were obtained. Bilateral screening digital breast
tomosynthesis was performed. The images were evaluated with
computer-aided detection.

[L MLO synth-2D]
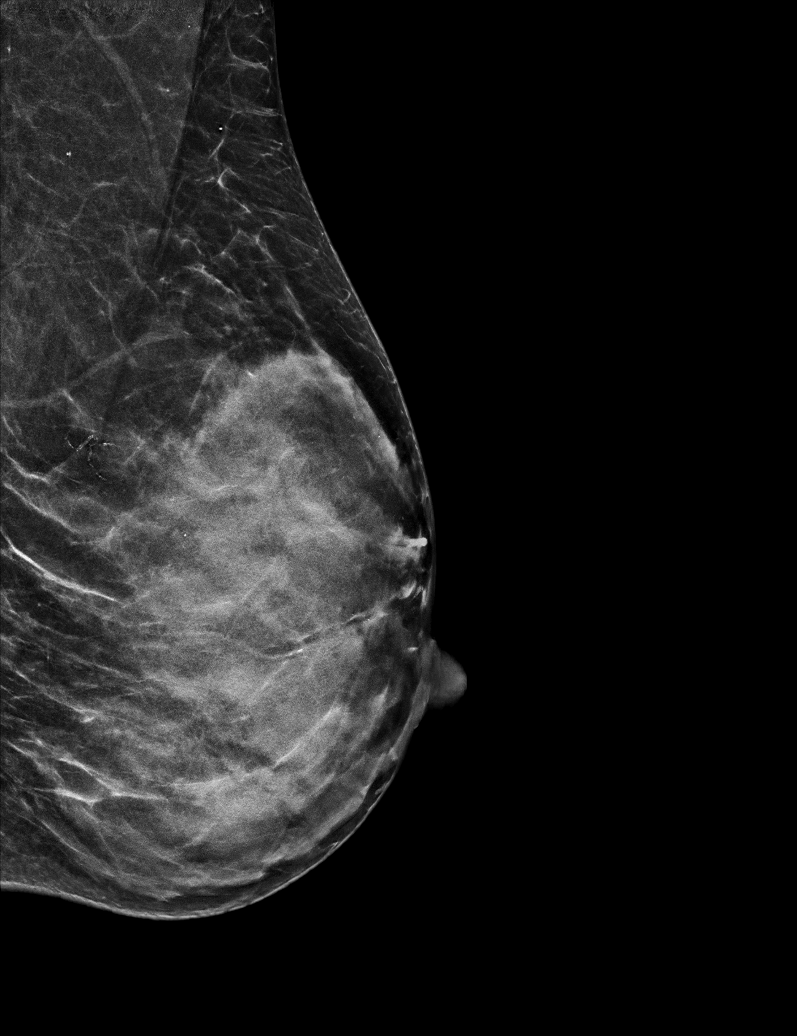

[R XCCL synth-2D]
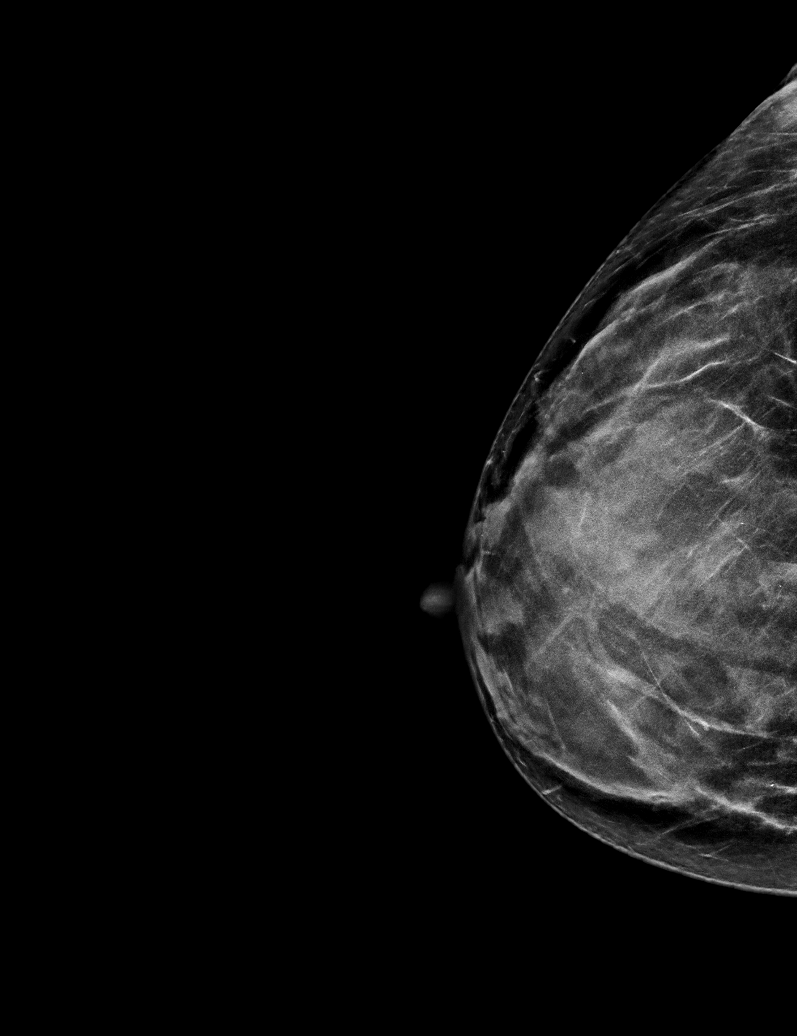

[R CC synth-2D]
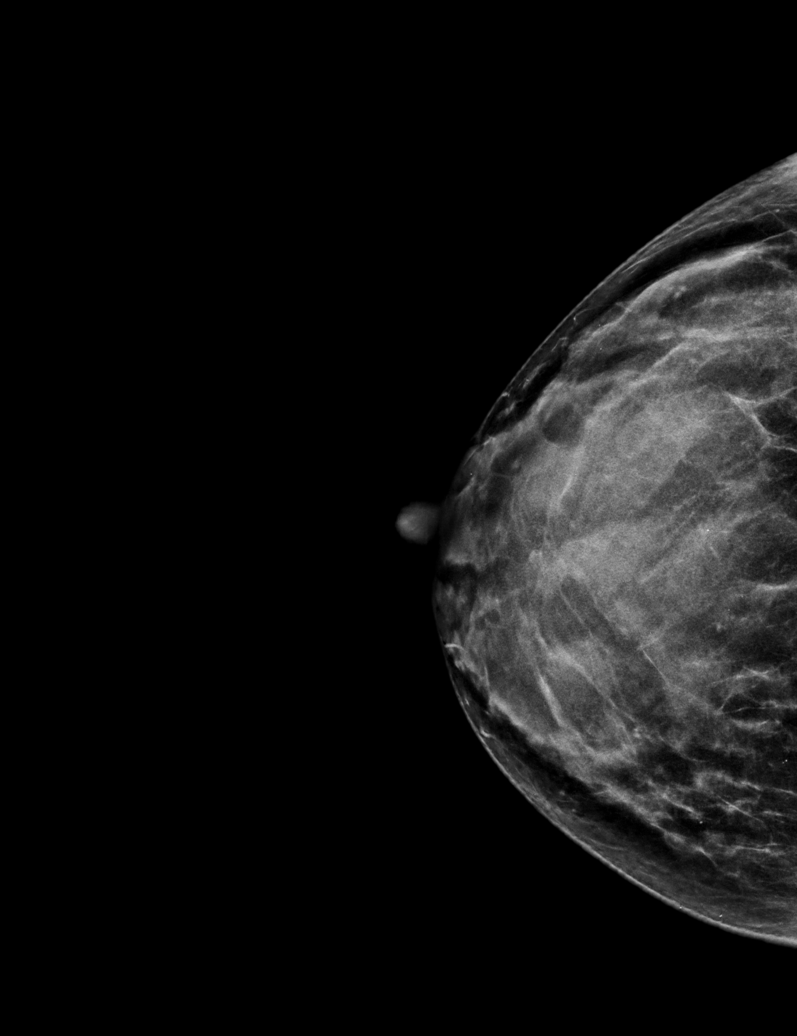

[R MLO synth-2D]
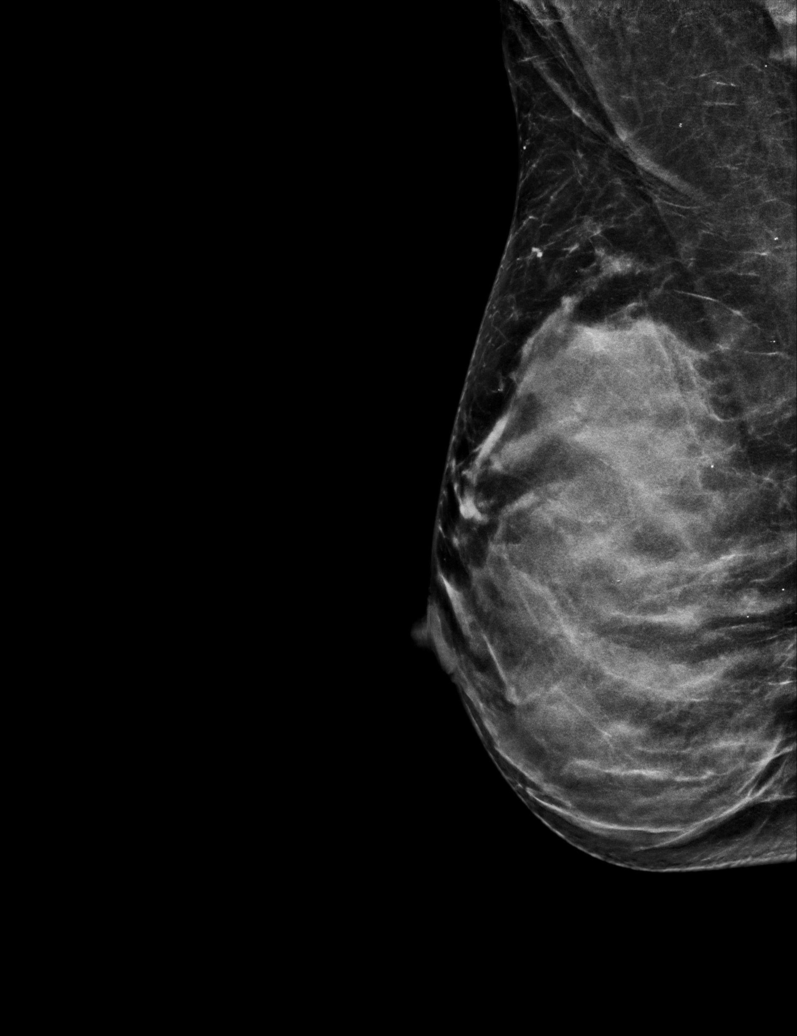

[L CC synth-2D]
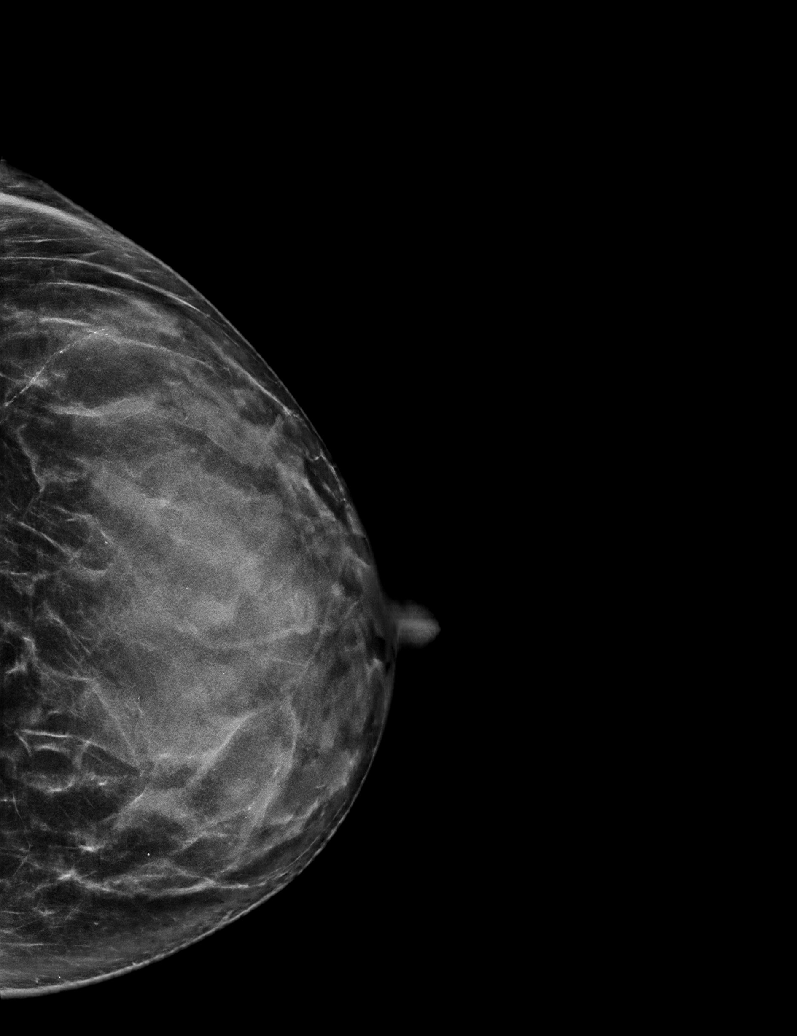

[L CC tomo · tomo slice 33/66.0]
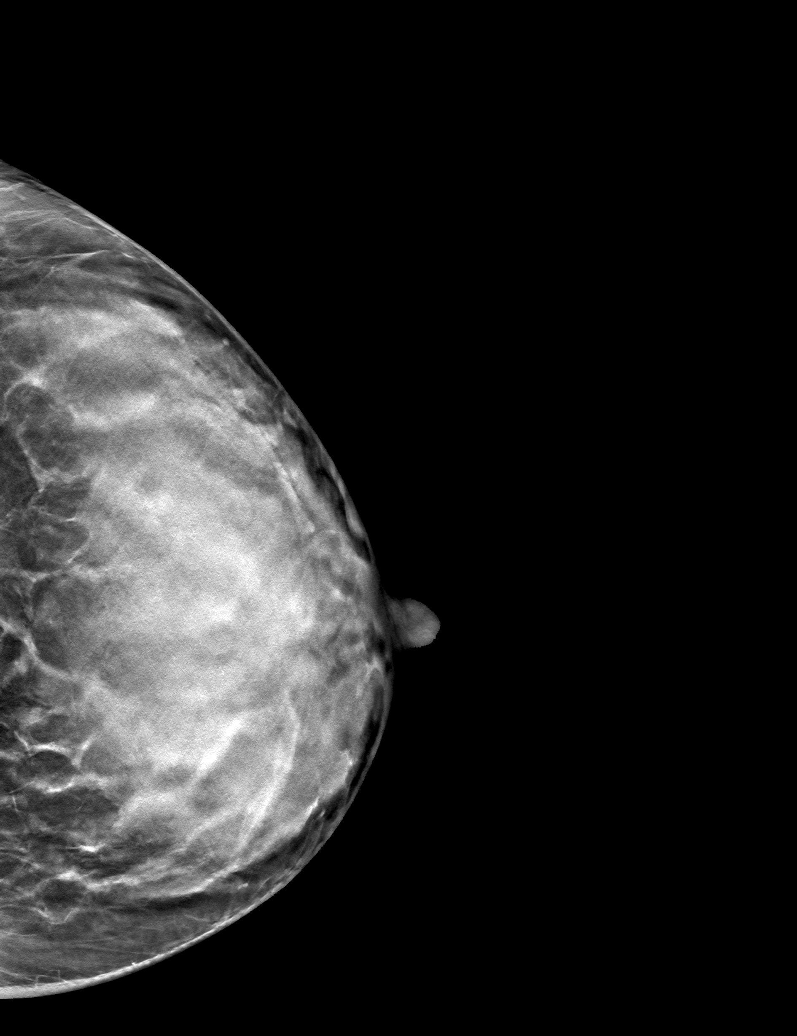

[6 of 30 positions shown; findings below may reference images not displayed]

ACR Breast Density Category d: The breast tissue is extremely dense,
which lowers the sensitivity of mammography.
FINDINGS: There are no findings suspicious for malignancy. The images were
evaluated with computer-aided detection.
IMPRESSION: No mammographic evidence of malignancy. A result letter of this
screening mammogram will be mailed directly to the patient.

RECOMMENDATION:
Screening mammogram in one year. (Code:[id number]-[id number])

BI-RADS CATEGORY  1: Negative.

## 2022-03-18 ENCOUNTER — Telehealth: Payer: BC Managed Care – PPO | Admitting: Family

## 2022-03-18 DIAGNOSIS — J069 Acute upper respiratory infection, unspecified: Secondary | ICD-10-CM

## 2022-03-18 MED ORDER — CETIRIZINE HCL 10 MG PO TABS: 10.0000 mg | ORAL_TABLET | Freq: Every day | ORAL | 1 refills | Status: DC

## 2022-03-18 MED ORDER — FLUTICASONE PROPIONATE 50 MCG/ACT NA SUSP: 2.0000 | Freq: Every day | NASAL | 6 refills | Status: DC

## 2022-03-18 NOTE — Progress Notes (Signed)
Virtual Visit Consent   Cynthia Diaz, you are scheduled for a virtual visit with a [facility] provider today. Just as with appointments in the office, your consent must be obtained to participate. Your consent will be active for this visit and any virtual visit you may have with one of our providers in the next 365 days. If you have a MyChart account, a copy of this consent can be sent to you electronically.  As this is a virtual visit, video technology does not allow for your provider to perform a traditional examination. This may limit your provider's ability to fully assess your condition. If your provider identifies any concerns that need to be evaluated in person or the need to arrange testing (such as labs, EKG, etc.), we will make arrangements to do so. Although advances in technology are sophisticated, we cannot ensure that it will always work on either your end or our end. If the connection with a video visit is poor, the visit may have to be switched to a telephone visit. With either a video or telephone visit, we are not always able to ensure that we have a secure connection.  By engaging in this virtual visit, you consent to the provision of healthcare and authorize for your insurance to be billed (if applicable) for the services provided during this visit. Depending on your insurance coverage, you may receive a charge related to this service.  I need to obtain your verbal consent now. Are you willing to proceed with your visit today? Cynthia Diaz has provided verbal consent on 03/18/2022 for a virtual visit (video or telephone). Evelina Dun, FNP  Date: 03/18/2022 9:26 AM  Virtual Visit via Video Note   I, Evelina Dun, connected with  Cynthia Diaz  ([id number], 1975-06-23) on 03/18/22 at  9:15 AM EST by a video-enabled telemedicine application and verified that I am speaking with the correct person using two identifiers.  Location: Patient: Virtual Visit Location Patient:  Home Provider: Virtual Visit Location Provider: Home Office   I discussed the limitations of evaluation and management by telemedicine and the availability of in person appointments. The patient expressed understanding and agreed to proceed.    History of Present Illness: Cynthia Diaz is a 47 y.o. who identifies as a female who was assigned female at birth, and is being seen today for nasal congestion for the last three days. Had a negative COVID test.   HPI: URI  This is a new problem. The current episode started in the past 7 days. The problem has been unchanged. There has been no fever. Associated symptoms include congestion, headaches, rhinorrhea, sinus pain, sneezing and a sore throat. Pertinent negatives include no coughing, dysuria, ear pain, nausea or vomiting. Associated symptoms comments: Fatigue . She has tried decongestant for the symptoms. The treatment provided mild relief.    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   Insomnia 04/27/2015   Generalized anxiety disorder 04/27/2015   GERD (gastroesophageal reflux disease) 04/27/2015    Allergies:  Allergies  Allergen Reactions   Amoxicillin Hives   Penicillins Hives   Medications:  Current Outpatient Medications:    cetirizine (ZYRTEC ALLERGY) 10 MG tablet, Take 1 tablet (10 mg total) by mouth daily., Disp: 90 tablet, Rfl: 1   fluticasone (FLONASE) 50 MCG/ACT nasal spray, Place  2 sprays into both nostrils daily., Disp: 16 g, Rfl: 6   busPIRone (BUSPAR) 10 MG tablet, TAKE 1 AND 1/2 TABLETS BY MOUTH TWICE A DAY FOR ANXIETY., Disp: 270 tablet, Rfl: 3   sertraline (ZOLOFT) 100 MG tablet, Take 1 tablet (100 mg total) by mouth daily. For anxiety, Disp: 90 tablet, Rfl: 3  Observations/Objective: Patient is well-developed, well-nourished in no acute distress.   Resting comfortably  at home.  Head is normocephalic, atraumatic.  No labored breathing.  Speech is clear and coherent with logical content.  Patient is alert and oriented at baseline.  Nasal congestion   Assessment and Plan: 1. Viral URI - fluticasone (FLONASE) 50 MCG/ACT nasal spray; Place 2 sprays into both nostrils daily.  Dispense: 16 g; Refill: 6 - cetirizine (ZYRTEC ALLERGY) 10 MG tablet; Take 1 tablet (10 mg total) by mouth daily.  Dispense: 90 tablet; Refill: 1  - Take meds as prescribed - Use a cool mist humidifier  -Use saline nose sprays frequently -Force fluids -For any cough or congestion  Use plain Mucinex- regular strength or max strength is fine -For fever or aces or pains- take tylenol or ibuprofen. -Throat lozenges if help -Follow up if symptoms worsen or do not improve   Follow Up Instructions: I discussed the assessment and treatment plan with the patient. The patient was provided an opportunity to ask questions and all were answered. The patient agreed with the plan and demonstrated an understanding of the instructions.  A copy of instructions were sent to the patient via MyChart unless otherwise noted below.     The patient was advised to call back or seek an in-person evaluation if the symptoms worsen or if the condition fails to improve as anticipated.  Time:  I spent 6 minutes with the patient via telehealth technology discussing the above problems/concerns.    Evelina Dun, FNP

## 2022-05-03 NOTE — Telephone Encounter (Signed)
Patient will be due for CPE after 05/25/22.

## 2022-05-21 ENCOUNTER — Other Ambulatory Visit: Payer: Self-pay | Admitting: Primary Care

## 2022-05-21 DIAGNOSIS — F411 Generalized anxiety disorder: Secondary | ICD-10-CM

## 2022-05-30 ENCOUNTER — Encounter: Payer: BC Managed Care – PPO | Admitting: Primary Care

## 2022-06-06 ENCOUNTER — Ambulatory Visit (INDEPENDENT_AMBULATORY_CARE_PROVIDER_SITE_OTHER): Payer: BC Managed Care – PPO | Admitting: Primary Care

## 2022-06-06 ENCOUNTER — Encounter: Payer: Self-pay | Admitting: Primary Care

## 2022-06-06 VITALS — BP 116/60 | HR 59 | Temp 98.4°F | Ht 69.0 in | Wt 159.0 lb

## 2022-06-06 DIAGNOSIS — E559 Vitamin D deficiency, unspecified: Secondary | ICD-10-CM

## 2022-06-06 DIAGNOSIS — M542 Cervicalgia: Secondary | ICD-10-CM | POA: Diagnosis not present

## 2022-06-06 DIAGNOSIS — E539 Vitamin B deficiency, unspecified: Secondary | ICD-10-CM

## 2022-06-06 DIAGNOSIS — G4700 Insomnia, unspecified: Secondary | ICD-10-CM | POA: Diagnosis not present

## 2022-06-06 DIAGNOSIS — R7989 Other specified abnormal findings of blood chemistry: Secondary | ICD-10-CM

## 2022-06-06 DIAGNOSIS — G8929 Other chronic pain: Secondary | ICD-10-CM

## 2022-06-06 DIAGNOSIS — Z1211 Encounter for screening for malignant neoplasm of colon: Secondary | ICD-10-CM

## 2022-06-06 DIAGNOSIS — Z1231 Encounter for screening mammogram for malignant neoplasm of breast: Secondary | ICD-10-CM

## 2022-06-06 DIAGNOSIS — Z136 Encounter for screening for cardiovascular disorders: Secondary | ICD-10-CM

## 2022-06-06 DIAGNOSIS — Z0000 Encounter for general adult medical examination without abnormal findings: Secondary | ICD-10-CM | POA: Diagnosis not present

## 2022-06-06 DIAGNOSIS — F411 Generalized anxiety disorder: Secondary | ICD-10-CM | POA: Diagnosis not present

## 2022-06-06 LAB — LIPID PANEL
Cholesterol: 173 mg/dL (ref 0–200)
HDL: 74.6 mg/dL (ref >39.00)
LDL Cholesterol: 83 mg/dL (ref 0–99)
NonHDL: 98.06
Total CHOL/HDL Ratio: 2
Triglycerides: 73 mg/dL (ref 0.0–149.0)
VLDL: 14.6 mg/dL (ref 0.0–40.0)

## 2022-06-06 LAB — TSH: TSH: 1.82 u[IU]/mL (ref 0.35–5.50)

## 2022-06-06 LAB — COMPREHENSIVE METABOLIC PANEL
ALT: 11 U/L (ref 0–35)
AST: 15 U/L (ref 0–37)
Albumin: 4.4 g/dL (ref 3.5–5.2)
Alkaline Phosphatase: 54 U/L (ref 39–117)
BUN: 10 mg/dL (ref 6–23)
CO2: 28 mEq/L (ref 19–32)
Calcium: 9.8 mg/dL (ref 8.4–10.5)
Chloride: 100 mEq/L (ref 96–112)
Creatinine, Ser: 0.91 mg/dL (ref 0.40–1.20)
GFR: 75.57 mL/min (ref >60.00)
Glucose, Bld: 81 mg/dL (ref 70–99)
Potassium: 4.3 mEq/L (ref 3.5–5.1)
Sodium: 137 mEq/L (ref 135–145)
Total Bilirubin: 0.5 mg/dL (ref 0.2–1.2)
Total Protein: 6.8 g/dL (ref 6.0–8.3)

## 2022-06-06 LAB — VITAMIN D 25 HYDROXY (VIT D DEFICIENCY, FRACTURES): VITD: 43.81 ng/mL (ref 30.00–100.00)

## 2022-06-06 LAB — VITAMIN B12: Vitamin B-12: 206 pg/mL — ABNORMAL LOW (ref 211–911)

## 2022-06-06 NOTE — Assessment & Plan Note (Signed)
Controlled.  Continue Buspirone 15 mg BID and Zoloft 100 mg daily.

## 2022-06-06 NOTE — Assessment & Plan Note (Signed)
Controlled.  Continue Zoloft 100 mg daily and buspirone 15 mg BID.

## 2022-06-06 NOTE — Progress Notes (Addendum)
Subjective:    Patient ID: Cynthia Diaz, female    DOB: 07-Aug-1975, 47 y.o.   MRN: [id number]  HPI  Cynthia Diaz is a very pleasant 47 y.o. female who presents today for complete physical and follow up of chronic conditions.  Immunizations: -Tetanus: Completed in 2017 -Influenza: Completed this season  Diet: Fair diet.  Exercise: Regular exercise.  Eye exam: Completes annually  Dental exam: Completes semi-annually    Pap Smear: December 2021 Mammogram: 2022  Colonoscopy: Never completed. Referred to GI last year and did not complete.    Wt Readings from Last 3 Encounters:  06/06/22 159 lb (72.1 kg)  05/24/21 155 lb (70.3 kg)  03/23/21 155 lb (70.3 kg)      Review of Systems  Constitutional:  Positive for fatigue. Negative for unexpected weight change.  HENT:  Negative for rhinorrhea.   Respiratory:  Negative for cough and shortness of breath.   Cardiovascular:  Negative for chest pain.  Gastrointestinal:  Negative for constipation and diarrhea.  Genitourinary:  Negative for difficulty urinating and menstrual problem.  Musculoskeletal:  Positive for arthralgias, myalgias and neck pain.  Skin:  Negative for rash.  Allergic/Immunologic: Negative for environmental allergies.  Neurological:  Positive for numbness. Negative for dizziness and headaches.  Psychiatric/Behavioral:  The patient is not nervous/anxious.          Past Medical History:  Diagnosis Date   Abnormal uterine bleeding 01/20/2020   Advanced maternal age, 1st pregnancy, unspecified trimester 07/02/2016   [facility] Prenatal Labs Dating  Blood type:    Genetic Screen 1 Screen:    AFP:     Quad:     NIPS: Antibody:  Anatomic [place]  Rubella:   Varicella: I GTT Early:               Third trimester:  RPR:    Rhogam  HBsAg:    TDaP vaccine                       Flu Shot: HIV:    Baby Food                                GBS:  Contraception  Pap: 06/01/15 ASCUS HPV negative CBB    CS/VBAC    Support Pers   Alcohol abuse    Chicken pox    Elevated blood pressure    Fibrocystic breast    Generalized anxiety disorder    Kidney stones    Supervision of high risk pregnancy, antepartum 07/11/2016   [facility] Prenatal Labs Dating 12 week [place] Blood type: O/Positive/-- (05/21 1523)  Genetic Screen NIPS: XX diploid Antibody:Negative (05/21 1523) Anatomic [place] Normal Rubella: 1.90 (05/21 1523) Varicella: Immune GTT 77 RPR: Non Reactive (09/12 1553)  Rhogam n/a HBsAg: Negative (05/21 1523)  TDaP vaccine 11/1            Flu Shot: 10/10 HIV:   Neg Baby Food Breast                       GBS: Posit   Vacuum extraction, delivered, current hospitalization 01/21/2017    Social History   Socioeconomic History   Marital status: Married    Spouse name: Not on file   Number of children: Not on file   Years of education: Not on file   Highest education level: Not on  file  Occupational History   Not on file  Tobacco Use   Smoking status: Never   Smokeless tobacco: Never  Vaping Use   Vaping Use: Never used  Substance and Sexual Activity   Alcohol use: No    Alcohol/week: 0.0 standard drinks of alcohol   Drug use: No   Sexual activity: Yes    Birth control/protection: Pill  Other Topics Concern   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   Enjoys exercising, running, playing soccer, art, gardening.   Social Determinants of Health   Financial Resource Strain: Not on file  Food Insecurity: Not on file  Transportation Needs: Not on file  Physical Activity: Insufficiently Active (07/04/2017)   Exercise Vital Sign    Days of Exercise per Week: 3 days    Minutes of Exercise per Session: 30 min  Stress: No Stress Concern Present (07/04/2017)   [employer] of Occupational Health - Occupational Stress Questionnaire    Feeling of Stress : Only a little  Social Connections: Not on file  Intimate Partner Violence: Not  on file    Past Surgical History:  Procedure Laterality Date   COLPOSCOPY  2005   KNEE SURGERY Left    WISDOM TOOTH EXTRACTION      Family History  Problem Relation Age of Onset   Arthritis Father    Hyperlipidemia Father    Hypertension Father    Breast cancer Maternal Aunt     Allergies  Allergen Reactions   Amoxicillin Hives   Penicillins Hives    Current Outpatient Medications on File Prior to Visit  Medication Sig Dispense Refill   busPIRone (BUSPAR) 10 MG tablet TAKE 1 AND 1/2 TABLETS BY MOUTH TWICE A DAY FOR ANXIETY. 270 tablet 0   sertraline (ZOLOFT) 100 MG tablet TAKE 1 TABLET (100 MG TOTAL) BY MOUTH DAILY. FOR ANXIETY 90 tablet 0   No current facility-administered medications on file prior to visit.    BP 116/60   Pulse (!) 59   Temp 98.4 F (36.9 C) (Temporal)   Ht 5\' 9"  (1.753 m)   Wt 159 lb (72.1 kg)   LMP 05/14/2022 (Approximate)   SpO2 98%   BMI 23.48 kg/m  Objective:   Physical Exam HENT:     Right Ear: Tympanic membrane and ear canal normal.     Left Ear: Tympanic membrane and ear canal normal.     Nose: Nose normal.  Eyes:     Conjunctiva/sclera: Conjunctivae normal.     Pupils: Pupils are equal, round, and reactive to light.  Neck:     Thyroid: No thyromegaly.  Cardiovascular:     Rate and Rhythm: Normal rate and regular rhythm.     Heart sounds: No murmur heard. Pulmonary:     Effort: Pulmonary effort is normal.     Breath sounds: Normal breath sounds. No rales.  Abdominal:     General: Bowel sounds are normal.     Palpations: Abdomen is soft.     Tenderness: There is no abdominal tenderness.  Musculoskeletal:        General: Normal range of motion.     Cervical back: Neck supple.  Lymphadenopathy:     Cervical: No cervical adenopathy.  Skin:    General: Skin is warm and dry.     Findings: No rash.  Neurological:     Mental Status: She is alert and oriented  to person, place, and time.     Cranial Nerves: No cranial nerve  deficit.     Deep Tendon Reflexes: Reflexes are normal and symmetric.  Psychiatric:        Mood and Affect: Mood normal.           Assessment & Plan:  Preventative health care Assessment & Plan: Immunizations UTD. Pap smear UTD. Mammogram due, orders placed. Colonoscopy due, referral placed to GI.  Discussed the importance of a healthy diet and regular exercise in order for weight loss, and to reduce the risk of further co-morbidity.  Exam stable. Labs pending.  Follow up in 1 year for repeat physical.   Orders: -     Lipid panel -     Comprehensive metabolic panel -     Vitamin B12 -     VITAMIN D 25 Hydroxy (Vit-D Deficiency, Fractures) -     TSH  Generalized anxiety disorder Assessment & Plan: Controlled.  Continue Zoloft 100 mg daily and buspirone 15 mg BID.    Insomnia, unspecified type Assessment & Plan: Controlled.  Continue Buspirone 15 mg BID and Zoloft 100 mg daily.   Chronic neck pain Assessment & Plan: Continued and chronic, now with numbness and tingling.  Working on exercise and conservative treatment.  Offered orthopedic referral for which she kindly declines. She will update.    Screening for colon cancer -     Ambulatory referral to Gastroenterology  Screening mammogram for breast cancer -     3D Screening Mammogram, Left and Right; Future  Low vitamin B12 level Assessment & Plan: Historically.  Repeat vitamin B12 level pending.    Vitamin D deficiency Assessment & Plan: Repeat vitamin D level pending.          Doreene Nest, NP

## 2022-06-06 NOTE — Assessment & Plan Note (Signed)
Immunizations UTD. Pap smear UTD. Mammogram due, orders placed. Colonoscopy due, referral placed to GI.  Discussed the importance of a healthy diet and regular exercise in order for weight loss, and to reduce the risk of further co-morbidity.  Exam stable. Labs pending.  Follow up in 1 year for repeat physical.  

## 2022-06-06 NOTE — Assessment & Plan Note (Addendum)
Continued and chronic, now with numbness and tingling.  Working on exercise and conservative treatment.  Offered orthopedic referral for which she kindly declines. She will update.

## 2022-06-06 NOTE — Patient Instructions (Signed)
Stop by the lab prior to leaving today. I will notify you of your results once received.   [facility] to schedule your mamm[facility].   You will be contacted via phone regarding your referral to GI for the colonoscopy.  It was a pleasure to see you today!   

## 2022-06-12 DIAGNOSIS — E559 Vitamin D deficiency, unspecified: Secondary | ICD-10-CM | POA: Insufficient documentation

## 2022-06-12 DIAGNOSIS — R7989 Other specified abnormal findings of blood chemistry: Secondary | ICD-10-CM | POA: Insufficient documentation

## 2022-06-12 NOTE — Assessment & Plan Note (Signed)
Repeat vitamin D level pending. 

## 2022-06-12 NOTE — Assessment & Plan Note (Signed)
Historically.  Repeat vitamin B12 level pending.

## 2022-06-21 ENCOUNTER — Encounter: Payer: Self-pay | Admitting: Gastroenterology

## 2022-07-05 ENCOUNTER — Ambulatory Visit (AMBULATORY_SURGERY_CENTER): Payer: BC Managed Care – PPO

## 2022-07-05 VITALS — Ht 69.0 in | Wt 158.0 lb

## 2022-07-05 DIAGNOSIS — Z1211 Encounter for screening for malignant neoplasm of colon: Secondary | ICD-10-CM

## 2022-07-05 DIAGNOSIS — Z83719 Family history of colon polyps, unspecified: Secondary | ICD-10-CM

## 2022-07-05 MED ORDER — NA SULFATE-K SULFATE-MG SULF 17.5-3.13-1.6 GM/177ML PO SOLN: 1.0000 | Freq: Once | ORAL | 0 refills | Status: AC

## 2022-07-05 NOTE — Progress Notes (Signed)
No egg or soy allergy known to patient  No issues known to pt with past sedation with any surgeries or procedures Patient denies ever being told they had issues or difficulty with intubation  No FH of Malignant Hyperthermia Pt is not on diet pills Pt is not on  home 02  Pt is not on blood thinners  Pt denies issues with constipation  No A fib or A flutter Have any cardiac testing pending-- no  Pt is ambulatory   Patient's chart reviewed by Cathlyn Parsons CNRA prior to previsit and patient appropriate for the [facility].  Previsit completed and red dot placed by patient's name on their procedure day (on provider's schedule).     PV completed with patient. Prep reviewed. Instructions sent via MyChart and to home address. Goodrx coupon for [facility] provided.  Pt instructed to use [email or web address] or GoodRx for a price reduction on prep

## 2022-07-06 ENCOUNTER — Encounter: Payer: Self-pay | Admitting: Gastroenterology

## 2022-07-23 ENCOUNTER — Ambulatory Visit (AMBULATORY_SURGERY_CENTER): Payer: BC Managed Care – PPO | Admitting: Gastroenterology

## 2022-07-23 ENCOUNTER — Encounter: Payer: Self-pay | Admitting: Gastroenterology

## 2022-07-23 VITALS — BP 118/82 | HR 64 | Temp 98.6°F | Resp 16 | Ht 69.0 in | Wt 158.0 lb

## 2022-07-23 DIAGNOSIS — Z1211 Encounter for screening for malignant neoplasm of colon: Secondary | ICD-10-CM

## 2022-07-23 DIAGNOSIS — D124 Benign neoplasm of descending colon: Secondary | ICD-10-CM | POA: Diagnosis not present

## 2022-07-23 MED ORDER — SODIUM CHLORIDE 0.9 % IV SOLN
500.0000 mL | Freq: Once | INTRAVENOUS | Status: DC
Start: 2022-07-23 — End: 2022-07-23

## 2022-07-23 NOTE — Progress Notes (Signed)
History and Physical:  This patient presents for endoscopic testing for: Encounter Diagnosis  Name Primary?   Special screening for malignant neoplasms, colon Yes    Average risk for colorectal cancer.  First screening exam.  Occasional HR bleeding, o/w no chronic GI symptoms   Patient is otherwise without complaints or active issues today.   Past Medical History: Past Medical History:  Diagnosis Date   Abnormal uterine bleeding 01/20/2020   Advanced maternal age, 1st pregnancy, unspecified trimester 07/02/2016   [facility] Prenatal Labs Dating  Blood type:    Genetic Screen 1 Screen:    AFP:     Quad:     NIPS: Antibody:  Anatomic [place]  Rubella:   Varicella: I GTT Early:               Third trimester:  RPR:    Rhogam  HBsAg:    TDaP vaccine                       Flu Shot: HIV:    Baby Food                                GBS:  Contraception  Pap: 06/01/15 ASCUS HPV negative CBB    CS/VBAC   Support Pers   Alcohol abuse    Chicken pox    Elevated blood pressure    Fibrocystic breast    Generalized anxiety disorder    Kidney stones    Supervision of high risk pregnancy, antepartum 07/11/2016   [facility] Prenatal Labs Dating 12 week [place] Blood type: O/Positive/-- (05/21 1523)  Genetic Screen NIPS: XX diploid Antibody:Negative (05/21 1523) Anatomic [place] Normal Rubella: 1.90 (05/21 1523) Varicella: Immune GTT 77 RPR: Non Reactive (09/12 1553)  Rhogam n/a HBsAg: Negative (05/21 1523)  TDaP vaccine 11/1            Flu Shot: 10/10 HIV:   Neg Baby Food Breast                       GBS: Posit   Vacuum extraction, delivered, current hospitalization 01/21/2017     Past Surgical History: Past Surgical History:  Procedure Laterality Date   COLPOSCOPY  2005   KNEE SURGERY Left    WISDOM TOOTH EXTRACTION      Allergies: Allergies  Allergen Reactions   Amoxicillin Hives   Penicillins Hives    Outpatient Meds: Current Outpatient Medications  Medication Sig Dispense Refill    busPIRone (BUSPAR) 10 MG tablet TAKE 1 AND 1/2 TABLETS BY MOUTH TWICE A DAY FOR ANXIETY. 270 tablet 0   sertraline (ZOLOFT) 100 MG tablet TAKE 1 TABLET (100 MG TOTAL) BY MOUTH DAILY. FOR ANXIETY 90 tablet 0   Current Facility-Administered Medications  Medication Dose Route Frequency Provider Last Rate Last Admin   0.9 %  sodium chloride infusion  500 mL Intravenous Once Danis, Starr Lake III, MD          ___________________________________________________________________ Objective   Exam:  BP 116/70   Pulse 70   Temp 98.6 F (37 C) (Temporal)   Ht 5\' 9"  (1.753 m)   Wt 158 lb (71.7 kg)   SpO2 98%   BMI 23.33 kg/m   CV: regular , S1/S2 Resp: clear to auscultation bilaterally, normal RR and effort noted GI: soft, no tenderness, with active bowel sounds.   Assessment: Encounter Diagnosis  Name Primary?   Special screening for malignant neoplasms, colon Yes     Plan: Colonoscopy   The benefits and risks of the planned procedure were described in detail with the patient or (when appropriate) their health care proxy.  Risks were outlined as including, but not limited to, bleeding, infection, perforation, adverse medication reaction leading to cardiac or pulmonary decompensation, pancreatitis (if ERCP).  The limitation of incomplete mucosal visualization was also discussed.  No guarantees or warranties were given.  The patient is appropriate for an endoscopic procedure in the ambulatory setting.   - Amada Jupiter, MD

## 2022-07-23 NOTE — Progress Notes (Signed)
Pt's states no medical or surgical changes since previsit or office visit. 

## 2022-07-23 NOTE — Patient Instructions (Addendum)
Continue present medications. Await pathology results.   YOU HAD AN ENDOSCOPIC PROCEDURE TODAY AT THE [facility]:   Refer to the procedure report that was given to you for any specific questions about what was found during the examination.  If the procedure report does not answer your questions, please call your gastroenterologist to clarify.  If you requested that your care partner not be given the details of your procedure findings, then the procedure report has been included in a sealed envelope for you to review at your convenience later.  YOU SHOULD EXPECT: Some feelings of bloating in the abdomen. Passage of more gas than usual.  Walking can help get rid of the air that was put into your GI tract during the procedure and reduce the bloating. If you had a lower endoscopy (such as a colonoscopy or flexible sigmoidoscopy) you may notice spotting of blood in your stool or on the toilet paper. If you underwent a bowel prep for your procedure, you may not have a normal bowel movement for a few days.  Please Note:  You might notice some irritation and congestion in your nose or some drainage.  This is from the oxygen used during your procedure.  There is no need for concern and it should clear up in a day or so.  SYMPTOMS TO REPORT IMMEDIATELY:  Following lower endoscopy (colonoscopy or flexible sigmoidoscopy):  Excessive amounts of blood in the stool  Significant tenderness or worsening of abdominal pains  Swelling of the abdomen that is new, acute  Fever of 100F or higher  For urgent or emergent issues, a gastroenterologist can be reached at any hour by calling [phone]. Do not use MyChart messaging for urgent concerns.    DIET:  We do recommend a small meal at first, but then you may proceed to your regular diet.  Drink plenty of fluids but you should avoid alcoholic beverages for 24 hours.  ACTIVITY:  You should plan to take it easy for the rest of today and you should  NOT DRIVE or use heavy machinery until tomorrow (because of the sedation medicines used during the test).    FOLLOW UP: Our staff will call the number listed on your records the next business day following your procedure.  We will call around 7:15- 8:00 am to check on you and address any questions or concerns that you may have regarding the information given to you following your procedure. If we do not reach you, we will leave a message.     If any biopsies were taken you will be contacted by phone or by letter within the next 1-3 weeks.  Please call us at [phone] if you have not heard about the biopsies in 3 weeks.    SIGNATURES/CONFIDENTIALITY: You and/or your care partner have signed paperwork which will be entered into your electronic medical record.  These signatures attest to the fact that that the information above on your After Visit Summary has been reviewed and is understood.  Full responsibility of the confidentiality of this discharge information lies with you and/or your care-partner.  

## 2022-07-23 NOTE — Progress Notes (Signed)
To pacu, VSS. Report to [email or web address] 

## 2022-07-23 NOTE — Progress Notes (Signed)
Called to room to assist during endoscopic procedure.  Patient ID and intended procedure confirmed with present staff. Received instructions for my participation in the procedure from the performing physician.  

## 2022-07-23 NOTE — Op Note (Signed)
[facility] Patient Name: Cynthia Diaz Procedure Date: 07/23/2022 11:37 AM MRN: [id number] Endoscopist: Sherilyn Cooter L. Myrtie Neither , MD, [id number] Age: 47 Referring MD:  Date of Birth: [birth year] Gender: Female Account #: [id number] Procedure:                Colonoscopy Indications:              Screening for colorectal malignant neoplasm, This                            is the patient's first colonoscopy Medicines:                Monitored Anesthesia Care Procedure:                Pre-Anesthesia Assessment:                           - Prior to the procedure, a History and Physical                            was performed, and patient medications and                            allergies were reviewed. The patient's tolerance of                            previous anesthesia was also reviewed. The risks                            and benefits of the procedure and the sedation                            options and risks were discussed with the patient.                            All questions were answered, and informed consent                            was obtained. Prior Anticoagulants: The patient has                            taken no anticoagulant or antiplatelet agents. ASA                            Grade Assessment: II - A patient with mild systemic                            disease. After reviewing the risks and benefits,                            the patient was deemed in satisfactory condition to                            undergo the procedure.  After obtaining informed consent, the colonoscope                            was passed under direct vision. Throughout the                            procedure, the patient's blood pressure, pulse, and                            oxygen saturations were monitored continuously. The                            CF [id number] #[id number] was introduced through the anus                            and advanced to  the the cecum, identified by                            appendiceal orifice and ileocecal valve. The                            colonoscopy was performed without difficulty. The                            patient tolerated the procedure well. The quality                            of the bowel preparation was excellent. The                            ileocecal valve, appendiceal orifice, and rectum                            were photographed. Scope In: 11:44:43 AM Scope Out: 11:57:37 AM Scope Withdrawal Time: 0 hours 10 minutes 23 seconds  Total Procedure Duration: 0 hours 12 minutes 54 seconds  Findings:                 Skin tag and redundant skin folds were found on                            perianal exam.                           Repeat examination of right colon under NBI                            performed.                           Diverticula were found in the entire colon.                           A diminutive polyp was found in the proximal  descending colon. The polyp was semi-sessile. The                            polyp was removed with a cold snare. Resection and                            retrieval were complete.                           Internal hemorrhoids were found.                           The exam was otherwise without abnormality on                            direct and retroflexion views. Complications:            No immediate complications. Estimated Blood Loss:     Estimated blood loss was minimal. Impression:               - Perianal skin tags found on perianal exam.                           - Diverticulosis in the entire examined colon.                           - One diminutive polyp in the proximal descending                            colon, removed with a cold snare. Resected and                            retrieved.                           - Internal hemorrhoids.                           - The examination was otherwise  normal on direct                            and retroflexion views. Recommendation:           - Patient has a contact number available for                            emergencies. The signs and symptoms of potential                            delayed complications were discussed with the                            patient. Return to normal activities tomorrow.                            Written discharge instructions were provided to the  patient.                           - Resume previous diet.                           - Continue present medications.                           - Await pathology results.                           - Repeat colonoscopy is recommended for                            surveillance. The colonoscopy date will be                            determined after pathology results from today's                            exam become available for review. Matheson Vandehei L. Myrtie Neither, MD 07/23/2022 12:00:41 PM This report has been signed electronically.

## 2022-07-24 ENCOUNTER — Telehealth: Payer: Self-pay | Admitting: *Deleted

## 2022-07-24 NOTE — Telephone Encounter (Signed)
  Follow up Call-     07/23/2022   10:58 AM  Call back number  Post procedure Call Back phone  # [phone]  Permission to leave phone message Yes     Patient questions:  Do you have a fever, pain , or abdominal swelling? No. Pain Score  0 *  Have you tolerated food without any problems? Yes.    Have you been able to return to your normal activities? Yes.    Do you have any questions about your discharge instructions: Diet   No. Medications  No. Follow up visit  No.  Do you have questions or concerns about your Care? No.  Actions: * If pain score is 4 or above: No action needed, pain <4.

## 2022-07-26 ENCOUNTER — Encounter: Payer: Self-pay | Admitting: Gastroenterology

## 2022-08-18 ENCOUNTER — Other Ambulatory Visit: Payer: Self-pay | Admitting: Primary Care

## 2022-08-18 DIAGNOSIS — F411 Generalized anxiety disorder: Secondary | ICD-10-CM

## 2022-08-21 ENCOUNTER — Ambulatory Visit
Admission: RE | Admit: 2022-08-21 | Discharge: 2022-08-21 | Disposition: A | Discharge status: Home / Self Care | Payer: BC Managed Care – PPO | Source: Ambulatory Visit | Attending: Primary Care | Admitting: Primary Care

## 2022-08-21 DIAGNOSIS — Z1231 Encounter for screening mammogram for malignant neoplasm of breast: Secondary | ICD-10-CM | POA: Insufficient documentation

## 2022-09-19 ENCOUNTER — Encounter: Payer: Self-pay | Admitting: Primary Care

## 2022-09-19 ENCOUNTER — Ambulatory Visit (INDEPENDENT_AMBULATORY_CARE_PROVIDER_SITE_OTHER): Payer: BC Managed Care – PPO | Admitting: Primary Care

## 2022-09-19 VITALS — BP 110/62 | HR 60 | Temp 97.6°F | Ht 69.0 in | Wt 164.0 lb

## 2022-09-19 DIAGNOSIS — R7989 Other specified abnormal findings of blood chemistry: Secondary | ICD-10-CM | POA: Diagnosis not present

## 2022-09-19 DIAGNOSIS — N926 Irregular menstruation, unspecified: Secondary | ICD-10-CM

## 2022-09-19 DIAGNOSIS — E559 Vitamin D deficiency, unspecified: Secondary | ICD-10-CM

## 2022-09-19 DIAGNOSIS — Z1159 Encounter for screening for other viral diseases: Secondary | ICD-10-CM | POA: Diagnosis not present

## 2022-09-19 LAB — CBC
HCT: 41.4 % (ref 36.0–46.0)
Hemoglobin: 13.6 g/dL (ref 12.0–15.0)
MCHC: 32.9 g/dL (ref 30.0–36.0)
MCV: 92.9 fl (ref 78.0–100.0)
Platelets: 272 10*3/uL (ref 150.0–400.0)
RBC: 4.46 Mil/uL (ref 3.87–5.11)
RDW: 12.5 % (ref 11.5–15.5)
WBC: 4.5 10*3/uL (ref 4.0–10.5)

## 2022-09-19 LAB — VITAMIN B12: Vitamin B-12: 396 pg/mL (ref 211–911)

## 2022-09-19 LAB — FOLLICLE STIMULATING HORMONE: FSH: 12.8 m[IU]/mL

## 2022-09-19 NOTE — Assessment & Plan Note (Signed)
Symptoms suggestive of perimenopause, especially given her family history.  Checking labs today including estradiol, FSH, prolactin, CBC, progesterone. Reviewed TSH from April 2024 which was within normal range.  Discussed common symptoms of perimenopause, definition of menopause and what to expect.  Consider pelvic/transvaginal ultrasound if warranted.

## 2022-09-19 NOTE — Progress Notes (Signed)
Subjective:    Patient ID: Cynthia Diaz, female    DOB: Dec 22, 1975, 47 y.o.   MRN: [id number]  HPI  Cynthia Diaz is a very pleasant 47 y.o. female with a history of GAD, low vitamin B12 level, vitamin D deficiency, GERD who presents today to discuss breast tenderness and changes in menstrual cycles.   Over the last two months she's noticed that her menstrual cycles have been 2 days shorter than usual. She's also noticed breast soreness after menstrual cycles which is unlike her. She typically experiences breast soreness prior to menses. She recently began her menstrual cycle earlier than usual. The last two days have been heavy which is unusual. She has felt tired and "foggy".   She completed a home pregnancy test which was negative. She questions if her symptoms are secondary to changes in her diet.   She denies hot flashes, fluctuations in mood. Her mother was in her early 35s when she went through menopause. Her sister was in her mid 53s.  She does have a history of progesterone depletion which was secondary to a NuvaRing.  Review of Systems  Constitutional:  Positive for fatigue.  Cardiovascular:  Negative for palpitations.  Genitourinary:  Positive for menstrual problem.  Psychiatric/Behavioral:  The patient is not nervous/anxious.          Past Medical History:  Diagnosis Date   Abnormal uterine bleeding 01/20/2020   Advanced maternal age, 1st pregnancy, unspecified trimester 07/02/2016   [facility] Prenatal Labs Dating  Blood type:    Genetic Screen 1 Screen:    AFP:     Quad:     NIPS: Antibody:  Anatomic [place]  Rubella:   Varicella: I GTT Early:               Third trimester:  RPR:    Rhogam  HBsAg:    TDaP vaccine                       Flu Shot: HIV:    Baby Food                                GBS:  Contraception  Pap: 06/01/15 ASCUS HPV negative CBB    CS/VBAC   Support Pers   Alcohol abuse    Chicken pox    Elevated blood pressure    Fibrocystic breast     Generalized anxiety disorder    Kidney stones    Supervision of high risk pregnancy, antepartum 07/11/2016   [facility] Prenatal Labs Dating 12 week [place] Blood type: O/Positive/-- (05/21 1523)  Genetic Screen NIPS: XX diploid Antibody:Negative (05/21 1523) Anatomic [place] Normal Rubella: 1.90 (05/21 1523) Varicella: Immune GTT 77 RPR: Non Reactive (09/12 1553)  Rhogam n/a HBsAg: Negative (05/21 1523)  TDaP vaccine 11/1            Flu Shot: 10/10 HIV:   Neg Baby Food Breast                       GBS: Posit   Vacuum extraction, delivered, current hospitalization 01/21/2017    Social History   Socioeconomic History   Marital status: Married    Spouse name: Not on file   Number of children: Not on file   Years of education: Not on file   Highest education level: Not on file  Occupational History  Not on file  Tobacco Use   Smoking status: Never   Smokeless tobacco: Never  Vaping Use   Vaping status: Never Used  Substance and Sexual Activity   Alcohol use: No    Alcohol/week: 0.0 standard drinks of alcohol   Drug use: No   Sexual activity: Yes  Other Topics Concern   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   Enjoys exercising, running, playing soccer, art, gardening.   Social Determinants of Health   Financial Resource Strain: Not on file  Food Insecurity: Not on file  Transportation Needs: Not on file  Physical Activity: Insufficiently Active (07/04/2017)   Exercise Vital Sign    Days of Exercise per Week: 3 days    Minutes of Exercise per Session: 30 min  Stress: No Stress Concern Present (07/04/2017)   [employer] of Occupational Health - Occupational Stress Questionnaire    Feeling of Stress : Only a little  Social Connections: Not on file  Intimate Partner Violence: Not on file    Past Surgical History:  Procedure Laterality Date   COLPOSCOPY  2005   KNEE SURGERY Left    WISDOM TOOTH EXTRACTION       Family History  Problem Relation Age of Onset   Colon polyps Mother    Colon polyps Father    Arthritis Father    Hyperlipidemia Father    Hypertension Father    Colon polyps Sister    Colon polyps Brother    Breast cancer Maternal Aunt    Colon cancer Neg Hx    Esophageal cancer Neg Hx    Rectal cancer Neg Hx    Stomach cancer Neg Hx     Allergies  Allergen Reactions   Amoxicillin Hives   Penicillins Hives    Current Outpatient Medications on File Prior to Visit  Medication Sig Dispense Refill   busPIRone (BUSPAR) 10 MG tablet TAKE 1 AND 1/2 TABLETS BY MOUTH TWICE A DAY FOR ANXIETY. 270 tablet 2   sertraline (ZOLOFT) 100 MG tablet TAKE 1 TABLET (100 MG TOTAL) BY MOUTH DAILY. FOR ANXIETY 90 tablet 2   No current facility-administered medications on file prior to visit.    BP 110/62   Pulse 60   Temp 97.6 F (36.4 C) (Temporal)   Ht 5\' 9"  (1.753 m)   Wt 164 lb (74.4 kg)   LMP 09/15/2022   SpO2 100%   BMI 24.22 kg/m  Objective:   Physical Exam Cardiovascular:     Rate and Rhythm: Normal rate and regular rhythm.  Pulmonary:     Effort: Pulmonary effort is normal.     Breath sounds: Normal breath sounds.  Musculoskeletal:     Cervical back: Neck supple.  Skin:    General: Skin is warm and dry.           Assessment & Plan:  Irregular periods/menstrual cycles Assessment & Plan: Symptoms suggestive of perimenopause, especially given her family history.  Checking labs today including estradiol, FSH, prolactin, CBC, progesterone. Reviewed TSH from April 2024 which was within normal range.  Discussed common symptoms of perimenopause, definition of menopause and what to expect.  Consider pelvic/transvaginal ultrasound if warranted.  Orders: -     Prolactin -     Follicle stimulating hormone -     Estradiol -     Progesterone -     CBC  Encounter for hepatitis C  screening test for low risk patient -     Hepatitis C antibody  Low vitamin B12  level Assessment & Plan: Continue vitamin B12 supplement. Repeat vitamin B12 level pending.  Orders: -     Vitamin B12  Vitamin D deficiency        Doreene Nest, NP

## 2022-09-19 NOTE — Assessment & Plan Note (Signed)
Continue vitamin B12 supplement. Repeat vitamin B12 level pending.

## 2022-09-19 NOTE — Patient Instructions (Signed)
Stop by the lab prior to leaving today. I will notify you of your results once received.   It was a pleasure to see you today!  

## 2023-04-04 ENCOUNTER — Ambulatory Visit (INDEPENDENT_AMBULATORY_CARE_PROVIDER_SITE_OTHER): Payer: BC Managed Care – PPO | Admitting: Primary Care

## 2023-04-04 ENCOUNTER — Ambulatory Visit
Admission: RE | Admit: 2023-04-04 | Discharge: 2023-04-04 | Disposition: A | Discharge status: Home / Self Care | Payer: BC Managed Care – PPO | Source: Ambulatory Visit | Attending: Primary Care

## 2023-04-04 VITALS — BP 130/78 | HR 63 | Temp 98.0°F | Ht 69.0 in | Wt 166.0 lb

## 2023-04-04 DIAGNOSIS — M542 Cervicalgia: Secondary | ICD-10-CM | POA: Diagnosis not present

## 2023-04-04 DIAGNOSIS — G8929 Other chronic pain: Secondary | ICD-10-CM | POA: Diagnosis not present

## 2023-04-04 DIAGNOSIS — H02402 Unspecified ptosis of left eyelid: Secondary | ICD-10-CM | POA: Diagnosis not present

## 2023-04-04 NOTE — Patient Instructions (Signed)
Stop by the lab and xray prior to leaving today. I will notify you of your results once received.   It was a pleasure to see you today!   

## 2023-04-04 NOTE — Assessment & Plan Note (Signed)
Will update plain films of the cervical spine today given continued symptoms.  X-rays of the cervical spine ordered and pending.

## 2023-04-04 NOTE — Progress Notes (Signed)
Subjective:    Patient ID: Cynthia Diaz, female    DOB: [birth year], 48 y.o.   MRN: [id number]  HPI  Cynthia Diaz is a very pleasant 48 y.o. female with a history of anxiety disorder, chronic neck pain, vitamin D and B12 deficiency who presents today to discuss ptosis.   Symptom onset about 4-6 weeks ago with left upper eyelid drooping. She's noticed that she can see more of her left upper eyelid than her right with her eyes open. She notices her symptom more towards the end of her day.  She participates in Zoom calls throughout the day, isn't sure if this is the cause. She's also been under increased stress for months, tends to tense her face and neck. She's applied a new facial cream, isn't sure if this is contributing.   She denies diplopia, headaches, trauma, constricted and dilated pupil, facial numbness, skin color changes, itchy/watery eyes, sneezing.   She continues to experience chronic neck pain and muscle tension, particularly to the left lateral side. She is getting massages and using her home massager.  Her masseuse recently mentioned that her left sternocleidomastoid muscle was tense and more increased than usual.  Evaluated by her optometrist a few weeks ago, was told that everything looked fine. Was prescribed new contacts, allergy drops. She's tried the allergy drops and has not noticed improvement.  She is scheduled for Botox tomorrow.    Review of Systems  HENT:  Negative for sneezing.   Eyes:  Negative for photophobia, pain, discharge, redness and visual disturbance.       Ptosis of left upper eyelid  Skin:  Negative for color change.  Neurological:  Negative for numbness.         Past Medical History:  Diagnosis Date   Abnormal uterine bleeding 01/20/2020   Advanced maternal age, 1st pregnancy, unspecified trimester 07/02/2016   [facility] Dating  Blood type:    Genetic Screen 1 Screen:    AFP:     Quad:     NIPS: Antibody:   Anatomic [place]  Rubella:   Varicella: I GTT Early:               Third trimester:  RPR:    Rhogam  HBsAg:    TDaP vaccine                       Flu Shot: HIV:    Baby Food                                GBS:  Contraception  Pap: 06/01/15 ASCUS HPV negative CBB    CS/VBAC   Support Pers   Alcohol abuse    Chicken pox    Elevated blood pressure    Fibrocystic breast    Generalized anxiety disorder    Kidney stones    Supervision of high risk pregnancy, antepartum 07/11/2016   [facility] Prenatal Labs Dating 12 week [place] Blood type: O/Positive/-- (05/21 1523)  Genetic Screen NIPS: XX diploid Antibody:Negative (05/21 1523) Anatomic [place] Normal Rubella: 1.90 (05/21 1523) Varicella: Immune GTT 77 RPR: Non Reactive (09/12 1553)  Rhogam n/a HBsAg: Negative (05/21 1523)  TDaP vaccine 11/1            Flu Shot: 10/10 HIV:   Neg Baby Food Breast  GBS: Posit   Vacuum extraction, delivered, current hospitalization 01/21/2017    Social History   Socioeconomic History   Marital status: Married    Spouse name: Not on file   Number of children: Not on file   Years of education: Not on file   Highest education level: Bachelor's degree (e.g., BA, AB, BS)  Occupational History   Not on file  Tobacco Use   Smoking status: Never   Smokeless tobacco: Never  Vaping Use   Vaping status: Never Used  Substance and Sexual Activity   Alcohol use: No    Alcohol/week: 0.0 standard drinks of alcohol   Drug use: No   Sexual activity: Yes  Other Topics Concern   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   Enjoys exercising, running, playing soccer, art, gardening.   Social Drivers of Corporate investment banker Strain: Low Risk  (04/04/2023)   Overall Financial Resource Strain (CARDIA)    Difficulty of Paying Living Expenses: Not hard at all  Food Insecurity: No Food Insecurity (04/04/2023)   Hunger Vital Sign    Worried About  Running Out of Food in the Last Year: Never true    Ran Out of Food in the Last Year: Never true  Transportation Needs: No Transportation Needs (04/04/2023)   PRAPARE - Administrator, Civil Service (Medical): No    Lack of Transportation (Non-Medical): No  Physical Activity: Sufficiently Active (04/04/2023)   Exercise Vital Sign    Days of Exercise per Week: 4 days    Minutes of Exercise per Session: 60 min  Stress: Stress Concern Present (04/04/2023)   [employer] of Occupational Health - Occupational Stress Questionnaire    Feeling of Stress : To some extent  Social Connections: Moderately Integrated (04/04/2023)   Social Connection and Isolation Panel [NHANES]    Frequency of Communication with Friends and Family: More than three times a week    Frequency of Social Gatherings with Friends and Family: More than three times a week    Attends Religious Services: More than 4 times per year    Active Member of [employer] or Organizations: No    Attends Engineer, structural: Not on file    Marital Status: Married  Catering manager Violence: Not on file    Past Surgical History:  Procedure Laterality Date   COLPOSCOPY  2005   KNEE SURGERY Left    WISDOM TOOTH EXTRACTION      Family History  Problem Relation Age of Onset   Colon polyps Mother    Colon polyps Father    Arthritis Father    Hyperlipidemia Father    Hypertension Father    Colon polyps Sister    Colon polyps Brother    Breast cancer Maternal Aunt    Colon cancer Neg Hx    Esophageal cancer Neg Hx    Rectal cancer Neg Hx    Stomach cancer Neg Hx     Allergies  Allergen Reactions   Amoxicillin Hives   Penicillins Hives    Current Outpatient Medications on File Prior to Visit  Medication Sig Dispense Refill   busPIRone (BUSPAR) 10 MG tablet TAKE 1 AND 1/2 TABLETS BY MOUTH TWICE A DAY FOR ANXIETY. 270 tablet 2   sertraline (ZOLOFT) 100 MG tablet TAKE 1 TABLET (100 MG TOTAL) BY MOUTH DAILY.  FOR ANXIETY 90 tablet 2  No current facility-administered medications on file prior to visit.    BP 130/78 (BP Location: Left Arm, Patient Position: Sitting, Cuff Size: Normal)   Pulse 63   Temp 98 F (36.7 C) (Oral)   Ht 5\' 9"  (1.753 m)   Wt 166 lb (75.3 kg)   SpO2 96%   BMI 24.51 kg/m  Objective:   Physical Exam Eyes:     General:        Left eye: No foreign body or discharge.     Extraocular Movements:     Left eye: Normal extraocular motion and no nystagmus.     Conjunctiva/sclera:     Left eye: Left conjunctiva is not injected. No exudate.    Comments: Mild swelling appearance to left upper eyelid.   Cardiovascular:     Rate and Rhythm: Normal rate.  Pulmonary:     Effort: Pulmonary effort is normal.  Neurological:     Mental Status: She is alert.           Assessment & Plan:  Ptosis of left eyelid Assessment & Plan: Exam today more representative of mild eyelid swelling rather than ptosis.  Need to rule out muscular disorders such as MG. Less representative of facial palsy, consider neurology evaluation.  Consider trial of prednisone.  Await results.  Orders: -     AChR Abs with Reflex to MuSK  Chronic neck pain Assessment & Plan: Will update plain films of the cervical spine today given continued symptoms.  X-rays of the cervical spine ordered and pending.  Orders: -     DG Cervical Spine 2 or 3 views        Doreene Nest, NP

## 2023-04-04 NOTE — Assessment & Plan Note (Signed)
Exam today more representative of mild eyelid swelling rather than ptosis.  Need to rule out muscular disorders such as MG. Less representative of facial palsy, consider neurology evaluation.  Consider trial of prednisone.  Await results.

## 2023-04-08 DIAGNOSIS — H02402 Unspecified ptosis of left eyelid: Secondary | ICD-10-CM

## 2023-04-08 DIAGNOSIS — G8929 Other chronic pain: Secondary | ICD-10-CM

## 2023-04-09 MED ORDER — PREDNISONE 20 MG PO TABS: ORAL_TABLET | ORAL | 0 refills | Status: DC

## 2023-04-15 LAB — MUSK ANTIBODIES: MuSK Antibodies: <1 U/mL

## 2023-04-15 LAB — ACHR ABS WITH REFLEX TO MUSK: AChR Binding Ab, Serum: <0.03 nmol/L (ref 0.00–0.24)

## 2023-04-15 NOTE — Telephone Encounter (Signed)
 Can we get the cervical spine x-ray read?

## 2023-04-16 DIAGNOSIS — G8929 Other chronic pain: Secondary | ICD-10-CM

## 2023-04-18 ENCOUNTER — Other Ambulatory Visit: Payer: Self-pay | Admitting: Primary Care

## 2023-04-18 DIAGNOSIS — F411 Generalized anxiety disorder: Secondary | ICD-10-CM

## 2023-05-23 ENCOUNTER — Ambulatory Visit: Admitting: Orthopedic Surgery

## 2023-05-23 ENCOUNTER — Other Ambulatory Visit: Payer: Self-pay | Admitting: Primary Care

## 2023-05-23 DIAGNOSIS — F411 Generalized anxiety disorder: Secondary | ICD-10-CM

## 2023-05-23 NOTE — Telephone Encounter (Signed)
 Patient scheduled.

## 2023-05-23 NOTE — Telephone Encounter (Signed)
Patient is due for CPE/follow up in May. Please schedule, thank you!

## 2023-06-21 ENCOUNTER — Encounter: Payer: Self-pay | Admitting: Primary Care

## 2023-06-21 ENCOUNTER — Ambulatory Visit (INDEPENDENT_AMBULATORY_CARE_PROVIDER_SITE_OTHER): Admitting: Primary Care

## 2023-06-21 VITALS — BP 110/64 | HR 62 | Temp 97.7°F | Ht 69.0 in | Wt 163.0 lb

## 2023-06-21 DIAGNOSIS — E559 Vitamin D deficiency, unspecified: Secondary | ICD-10-CM

## 2023-06-21 DIAGNOSIS — F411 Generalized anxiety disorder: Secondary | ICD-10-CM | POA: Diagnosis not present

## 2023-06-21 DIAGNOSIS — E538 Deficiency of other specified B group vitamins: Secondary | ICD-10-CM

## 2023-06-21 DIAGNOSIS — R7989 Other specified abnormal findings of blood chemistry: Secondary | ICD-10-CM

## 2023-06-21 DIAGNOSIS — Z0000 Encounter for general adult medical examination without abnormal findings: Secondary | ICD-10-CM

## 2023-06-21 DIAGNOSIS — G8929 Other chronic pain: Secondary | ICD-10-CM

## 2023-06-21 DIAGNOSIS — M542 Cervicalgia: Secondary | ICD-10-CM | POA: Diagnosis not present

## 2023-06-21 LAB — TSH: TSH: 1.86 u[IU]/mL (ref 0.35–5.50)

## 2023-06-21 LAB — LIPID PANEL
Cholesterol: 179 mg/dL (ref 0–200)
HDL: 78 mg/dL (ref >39.00)
LDL Cholesterol: 89 mg/dL (ref 0–99)
NonHDL: 100.66
Total CHOL/HDL Ratio: 2
Triglycerides: 60 mg/dL (ref 0.0–149.0)
VLDL: 12 mg/dL (ref 0.0–40.0)

## 2023-06-21 LAB — COMPREHENSIVE METABOLIC PANEL WITH GFR
ALT: 12 U/L (ref 0–35)
AST: 16 U/L (ref 0–37)
Albumin: 4.4 g/dL (ref 3.5–5.2)
Alkaline Phosphatase: 60 U/L (ref 39–117)
BUN: 12 mg/dL (ref 6–23)
CO2: 27 meq/L (ref 19–32)
Calcium: 9.4 mg/dL (ref 8.4–10.5)
Chloride: 100 meq/L (ref 96–112)
Creatinine, Ser: 0.95 mg/dL (ref 0.40–1.20)
GFR: 71.24 mL/min (ref >60.00)
Glucose, Bld: 78 mg/dL (ref 70–99)
Potassium: 4.4 meq/L (ref 3.5–5.1)
Sodium: 135 meq/L (ref 135–145)
Total Bilirubin: 0.5 mg/dL (ref 0.2–1.2)
Total Protein: 6.6 g/dL (ref 6.0–8.3)

## 2023-06-21 LAB — CBC
HCT: 39.5 % (ref 36.0–46.0)
Hemoglobin: 13.4 g/dL (ref 12.0–15.0)
MCHC: 34 g/dL (ref 30.0–36.0)
MCV: 91.9 fl (ref 78.0–100.0)
Platelets: 279 10*3/uL (ref 150.0–400.0)
RBC: 4.3 Mil/uL (ref 3.87–5.11)
RDW: 12.6 % (ref 11.5–15.5)
WBC: 7 10*3/uL (ref 4.0–10.5)

## 2023-06-21 LAB — VITAMIN B12: Vitamin B-12: 351 pg/mL (ref 211–911)

## 2023-06-21 LAB — VITAMIN D 25 HYDROXY (VIT D DEFICIENCY, FRACTURES): VITD: 49.79 ng/mL (ref 30.00–100.00)

## 2023-06-21 NOTE — Patient Instructions (Signed)
 Stop by the lab prior to leaving today. I will notify you of your results once received.   It was a pleasure to see you today!

## 2023-06-21 NOTE — Assessment & Plan Note (Signed)
 Continue multivimtain.  Repeat vitamin D  level pending.

## 2023-06-21 NOTE — Assessment & Plan Note (Addendum)
 Controlled overall.   Continue Zoloft  100 mg daily, buspirone  10 mg in AM and 20 mg in PM

## 2023-06-21 NOTE — Progress Notes (Signed)
 Subjective:    Patient ID: Cynthia Diaz, female    DOB: [birth year], 48 y.o.   MRN: [id number]  HPI  Cynthia Diaz is a very pleasant 48 y.o. female who presents today for complete physical and follow up of chronic conditions.  Immunizations: -Tetanus: Completed in 2017  Diet: Fair diet.  Exercise: Regular exercise.  Eye exam: Completes annually  Dental exam: Completes semi-annually    Pap Smear: Completed in 2021, follows with GYN Mammogram: Completed in July 2024  Colonoscopy: Completed in 2024, due 2031  BP Readings from Last 3 Encounters:  06/21/23 110/64  04/04/23 130/78  09/19/22 110/62       Review of Systems  Constitutional:  Negative for unexpected weight change.  HENT:  Negative for rhinorrhea.   Respiratory:  Negative for cough and shortness of breath.   Cardiovascular:  Negative for chest pain.  Gastrointestinal:  Negative for constipation and diarrhea.  Genitourinary:  Negative for difficulty urinating.  Musculoskeletal:  Negative for arthralgias and myalgias.  Skin:  Negative for rash.  Allergic/Immunologic: Negative for environmental allergies.  Neurological:  Negative for dizziness and headaches.  Psychiatric/Behavioral:  The patient is not nervous/anxious.          Past Medical History:  Diagnosis Date   Abnormal uterine bleeding 01/20/2020   Advanced maternal age, 1st pregnancy, unspecified trimester 07/02/2016   [facility] Dating  Blood type:    Genetic Screen 1 Screen:    AFP:     Quad:     NIPS: Antibody:  Anatomic US   Rubella:   Varicella: I GTT Early:               Third trimester:  RPR:    Rhogam  HBsAg:    TDaP vaccine                       Flu Shot: HIV:    Baby Food                                GBS:  Contraception  Pap: 06/01/15 ASCUS HPV negative CBB    CS/VBAC   Support Pers   Alcohol abuse    Chicken pox    Elevated blood pressure    Fibrocystic breast    Generalized anxiety disorder    Kidney  stones    Supervision of high risk pregnancy, antepartum 07/11/2016   [facility] P[facility]atal Labs Dating 12 week US  Blood type: O/Positive/-- (05/21 1523)  Genetic Screen NIPS: XX diploid Antibody:Negative (05/21 1523) Anatomic US  Normal Rubella: 1.90 (05/21 1523) Varicella: Immune GTT 77 RPR: Non Reactive (09/12 1553)  Rhogam n/a HBsAg: Negative (05/21 1523)  TDaP vaccine 11/1            Flu Shot: 10/10 HIV:   Neg Baby Food Breast                       GBS: Posit   Vacuum extraction, delivered, current hospitalization 01/21/2017    Social History   Socioeconomic History   Marital status: Married    Spouse name: Not on file   Number of children: Not on file   Years of education: Not on file   Highest education level: Bachelor's degree (e.g., BA, AB, BS)  Occupational History   Not on file  Tobacco Use   Smoking status: Never   Smokeless tobacco:  Never  Vaping Use   Vaping status: Never Used  Substance and Sexual Activity   Alcohol use: No    Alcohol/week: 0.0 standard drinks of alcohol   Drug use: No   Sexual activity: Yes  Other Topics Concern   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   Enjoys exercising, running, playing soccer, art, gardening.   Social Drivers of Corporate investment banker Strain: Low Risk  (04/04/2023)   Overall Financial Resource Strain (CARDIA)    Difficulty of Paying Living Expenses: Not hard at all  Food Insecurity: No Food Insecurity (04/04/2023)   Hunger Vital Sign    Worried About Running Out of Food in the Last Year: Never true    Ran Out of Food in the Last Year: Never true  Transportation Needs: No Transportation Needs (04/04/2023)   PRAPARE - Administrator, Civil Service (Medical): No    Lack of Transportation (Non-Medical): No  Physical Activity: Sufficiently Active (04/04/2023)   Exercise Vital Sign    Days of Exercise per Week: 4 days    Minutes of Exercise  per Session: 60 min  Stress: Stress Concern Present (04/04/2023)   [employer] of Occupational Health - Occupational Stress Questionnaire    Feeling of Stress : To some extent  Social Connections: Moderately Integrated (04/04/2023)   Social Connection and Isolation Panel [NHANES]    Frequency of Communication with Friends and Family: More than three times a week    Frequency of Social Gatherings with Friends and Family: More than three times a week    Attends Religious Services: More than 4 times per year    Active Member of [employer] or Organizations: No    Attends Engineer, structural: Not on file    Marital Status: Married  Catering manager Violence: Not on file    Past Surgical History:  Procedure Laterality Date   COLPOSCOPY  2005   KNEE SURGERY Left    WISDOM TOOTH EXTRACTION      Family History  Problem Relation Age of Onset   Colon polyps Mother    Colon polyps Father    Arthritis Father    Hyperlipidemia Father    Hypertension Father    Colon polyps Sister    Colon polyps Brother    Breast cancer Maternal Aunt    Colon cancer Neg Hx    Esophageal cancer Neg Hx    Rectal cancer Neg Hx    Stomach cancer Neg Hx     Allergies  Allergen Reactions   Amoxicillin Hives   Penicillins Hives    Current Outpatient Medications on File Prior to Visit  Medication Sig Dispense Refill   busPIRone  (BUSPAR ) 10 MG tablet TAKE 1 AND 1/2 TABLETS BY MOUTH TWICE A DAY FOR ANXIETY. 270 tablet 2   sertraline  (ZOLOFT ) 100 MG tablet TAKE 1 TABLET (100 MG TOTAL) BY MOUTH DAILY. FOR ANXIETY 90 tablet 0   No current facility-administered medications on file prior to visit.    BP 110/64   Pulse 62   Temp 97.7 F (36.5 C) (Temporal)   Ht 5\' 9"  (1.753 m)   Wt 163 lb (73.9 kg)   LMP 05/17/2023   SpO2 96%   BMI 24.07 kg/m  Objective:   Physical Exam HENT:     Right Ear: Tympanic membrane and ear canal normal.     Left Ear: Tympanic membrane  and ear canal normal.   Eyes:     Pupils: Pupils are equal, round, and reactive to light.  Cardiovascular:     Rate and Rhythm: Normal rate and regular rhythm.  Pulmonary:     Effort: Pulmonary effort is normal.     Breath sounds: Normal breath sounds.  Abdominal:     General: Bowel sounds are normal.     Palpations: Abdomen is soft.     Tenderness: There is no abdominal tenderness.  Musculoskeletal:        General: Normal range of motion.     Cervical back: Neck supple.  Skin:    General: Skin is warm and dry.  Neurological:     Mental Status: She is alert and oriented to person, place, and time.     Cranial Nerves: No cranial nerve deficit.     Deep Tendon Reflexes:     Reflex Scores:      Patellar reflexes are 2+ on the right side and 2+ on the left side. Psychiatric:        Mood and Affect: Mood normal.           Assessment & Plan:  Preventative health care Assessment & Plan: Immunizations UTD. Pap smear UTD. Follows with GYN Mammogram deferred for 2026 per patient request Colonoscopy UTD, due 2031  Discussed the importance of a healthy diet and regular exercise in order for weight loss, and to reduce the risk of further co-morbidity.  Exam stable. Labs pending.  Follow up in 1 year for repeat physical.   Orders: -     Lipid panel -     Comprehensive metabolic panel with GFR -     CBC -     TSH  Chronic neck pain Assessment & Plan: Ongoing.  Will follow with orthopedics in late May 2025.    Generalized anxiety disorder Assessment & Plan: Controlled overall.   Continue Zoloft  100 mg daily, buspirone  10 mg in AM and 20 mg in PM   Low vitamin B12 level Assessment & Plan: Continue vitamin B12 supplement.  Repeat B12 level pending.  Orders: -     Vitamin B12  Vitamin D  deficiency Assessment & Plan: Continue multivimtain.  Repeat vitamin D  level pending.  Orders: -     VITAMIN D  25 Hydroxy (Vit-D Deficiency, Fractures)        Dima Mini K Jeramy Dimmick,  NP

## 2023-06-21 NOTE — Assessment & Plan Note (Addendum)
 Continue vitamin B12 supplement.  Repeat B12 level pending.

## 2023-06-21 NOTE — Assessment & Plan Note (Signed)
 Ongoing.  Will follow with orthopedics in late May 2025.

## 2023-06-21 NOTE — Assessment & Plan Note (Addendum)
 Immunizations UTD. Pap smear UTD. Follows with GYN Mammogram deferred for 2026 per patient request Colonoscopy UTD, due 2031  Discussed the importance of a healthy diet and regular exercise in order for weight loss, and to reduce the risk of further co-morbidity.  Exam stable. Labs pending.  Follow up in 1 year for repeat physical.

## 2023-07-04 ENCOUNTER — Encounter: Payer: Self-pay | Admitting: Orthopedic Surgery

## 2023-07-04 ENCOUNTER — Encounter: Payer: Self-pay | Admitting: *Deleted

## 2023-07-04 ENCOUNTER — Ambulatory Visit (INDEPENDENT_AMBULATORY_CARE_PROVIDER_SITE_OTHER): Admitting: Orthopedic Surgery

## 2023-07-04 VITALS — BP 112/75 | HR 63 | Ht 69.0 in | Wt 166.0 lb

## 2023-07-04 DIAGNOSIS — G8929 Other chronic pain: Secondary | ICD-10-CM

## 2023-07-04 DIAGNOSIS — M542 Cervicalgia: Secondary | ICD-10-CM

## 2023-07-04 NOTE — Progress Notes (Signed)
 New Patient Visit  Assessment: Cynthia Diaz is a 48 y.o. female with the following: 1. Neck pain  Plan: Cynthia Diaz is a very active, healthy female who has neck pain.  No radiating symptoms.  No issues with her balance.  Radiographs demonstrates degenerative changes at C5-6, with progressively worsening kyphosis centered around this area.  This was reviewed with the patient.  Low concern for pressure on nerves at this point.  She has excellent strength in bilateral upper and lower extremities.  Discussed my limitations with treatment for her condition.  I offered her physical therapy.  She is not interested.  She is interested in discussing potential steroid injections.  Will place referral for her to be evaluated, with consideration for steroid injection of the cervical spine.  In addition, we can place referral for her to be evaluated by a neurosurgeon, if she is interested.  Follow-up: Return for Referral for neck injections.  Subjective:  Chief Complaint  Patient presents with   Neck Pain    Neck pain for  5 years or more and has been progressing recently. No medications on a regular but does take advil  pnr and red light as well     History of Present Illness: Cynthia Diaz is a 48 y.o. female who has been referred by Tretha Fu, NP for evaluation of neck pain.  She states that she has had pain in her neck for several years.  She is very active.  She works out at [employer]  Her neck pain continues to get worse.  She denies radiating pains into either arm.  No numbness or tingling.  Medications have not been effective.  She has tried massage.  She has worked with a Land.  She had x-rays several years ago, and was told that she had degenerative disc disease.  She is concerned about the recent progression.   Review of Systems: No fevers or chills No numbness or tingling No chest pain No shortness of breath No bowel or bladder dysfunction No GI distress No  headaches   Medical History:  Past Medical History:  Diagnosis Date   Abnormal uterine bleeding 01/20/2020   Advanced maternal age, 1st pregnancy, unspecified trimester 07/02/2016   [facility] Dating  Blood type:    Genetic Screen 1 Screen:    AFP:     Quad:     NIPS: Antibody:  Anatomic US   Rubella:   Varicella: I GTT Early:               Third trimester:  RPR:    Rhogam  HBsAg:    TDaP vaccine                       Flu Shot: HIV:    Baby Food                                GBS:  Contraception  Pap: 06/01/15 ASCUS HPV negative CBB    CS/VBAC   Support Pers   Alcohol abuse    Chicken pox    Elevated blood pressure    Fibrocystic breast    Generalized anxiety disorder    Kidney stones    Supervision of high risk pregnancy, antepartum 07/11/2016   [facility] P[facility]atal Labs Dating 12 week US  Blood type: O/Positive/-- (05/21 1523)  Genetic Screen NIPS: XX diploid Antibody:Negative (05/21 1523) Anatomic US  Normal  Rubella: 1.90 (05/21 1523) Varicella: Immune GTT 77 RPR: Non Reactive (09/12 1553)  Rhogam n/a HBsAg: Negative (05/21 1523)  TDaP vaccine 11/1            Flu Shot: 10/10 HIV:   Neg Baby Food Breast                       GBS: Posit   Vacuum extraction, delivered, current hospitalization 01/21/2017    Past Surgical History:  Procedure Laterality Date   COLPOSCOPY  2005   KNEE SURGERY Left    WISDOM TOOTH EXTRACTION      Family History  Problem Relation Age of Onset   Colon polyps Mother    Colon polyps Father    Arthritis Father    Hyperlipidemia Father    Hypertension Father    Colon polyps Sister    Colon polyps Brother    Breast cancer Maternal Aunt    Colon cancer Neg Hx    Esophageal cancer Neg Hx    Rectal cancer Neg Hx    Stomach cancer Neg Hx    Social History   Tobacco Use   Smoking status: Never   Smokeless tobacco: Never  Vaping Use   Vaping status: Never Used  Substance Use Topics   Alcohol use: No    Alcohol/week: 0.0 standard  drinks of alcohol   Drug use: No    Allergies  Allergen Reactions   Amoxicillin Hives   Penicillins Hives    Current Meds  Medication Sig   busPIRone  (BUSPAR ) 10 MG tablet TAKE 1 AND 1/2 TABLETS BY MOUTH TWICE A DAY FOR ANXIETY.   sertraline  (ZOLOFT ) 100 MG tablet TAKE 1 TABLET (100 MG TOTAL) BY MOUTH DAILY. FOR ANXIETY    Objective: BP 112/75   Pulse 63   Ht 5\' 9"  (1.753 m)   Wt 166 lb (75.3 kg)   LMP 05/17/2023   BMI 24.51 kg/m   Physical Exam:  General: Alert and oriented. and No acute distress. Gait: Normal gait.  Evaluation of the cervical spine and demonstrates no deformity.  No redness.  She has some discomfort with rotation of her neck, but minimal restrictions otherwise.  Negative Hoffmann's bilaterally.  She has excellent upper body strength.  Patellar tendon reflexes are 2+ bilaterally.  Sensation intact throughout bilateral upper and lower extremities.  5/5 strength in bilateral lower extremities.  IMAGING: I personally reviewed images previously obtained in clinic  X-ray of the cervical spine were previously obtained.  Degenerative changes, most advanced at C5-6.  There is some developing kyphosis in this area.   New Medications:  No orders of the defined types were placed in this encounter.     Tonita Frater, MD  07/04/2023 12:05 PM

## 2023-07-04 NOTE — Patient Instructions (Signed)
 Referral to see a pain specialist to consider steroid injections in the neck   If interested, we can refer you to see a neurosurgeon

## 2023-07-17 ENCOUNTER — Other Ambulatory Visit: Payer: Self-pay | Admitting: Primary Care

## 2023-07-17 DIAGNOSIS — F411 Generalized anxiety disorder: Secondary | ICD-10-CM

## 2023-09-12 DIAGNOSIS — D229 Melanocytic nevi, unspecified: Secondary | ICD-10-CM

## 2023-11-07 NOTE — Telephone Encounter (Signed)
 My chart sent to patient to let know pcp out of office.

## 2023-11-08 NOTE — Telephone Encounter (Signed)
 Let's get her scheduled for an office visit.

## 2023-12-16 ENCOUNTER — Encounter: Payer: Self-pay | Admitting: Radiology

## 2024-03-04 ENCOUNTER — Ambulatory Visit: Payer: Self-pay

## 2024-03-04 ENCOUNTER — Telehealth: Admitting: Emergency Medicine

## 2024-03-04 DIAGNOSIS — U071 COVID-19: Secondary | ICD-10-CM

## 2024-03-04 MED ORDER — NIRMATRELVIR/RITONAVIR (PAXLOVID)TABLET: 3.0000 | ORAL_TABLET | Freq: Two times a day (BID) | ORAL | 0 refills | Status: AC

## 2024-03-04 MED ORDER — ONDANSETRON HCL 4 MG PO TABS: 4.0000 mg | ORAL_TABLET | Freq: Three times a day (TID) | ORAL | 0 refills | Status: AC | PRN

## 2024-03-04 NOTE — Telephone Encounter (Signed)
 Copied from CRM #[id number]. Topic: Clinical - Medication Question >> Mar 04, 2024  1:02 PM Viola F wrote: Reason for CRM: Patient tested positive for covid wand wants to know if the Paxlovid  medication can be sent to pharmacy on file before symptoms start/get worse. She would like a call or message back via mychart with an update.   Sent patient my chart letting know she will need visit for script.

## 2024-03-04 NOTE — Telephone Encounter (Signed)
 FYI Only or Action Required?: FYI only for provider: appointment scheduled on 1/21 Virtual Urgent Care.  Patient was last seen in primary care on 06/21/2023 by Gretta Comer POUR, NP.  Called Nurse Triage reporting Covid Exposure.  Symptoms began yesterday.  Interventions attempted: Rest, hydration, or home remedies and Other: requesting Paxlovid , positive at home COVID test. Prefers [facility].   Symptoms are: gradually worsening.  Triage Disposition: Call PCP Within 24 Hours - MyChart communication noted, scheduled Virtual Visit.   Patient/caregiver understands and will follow disposition?: Yes   Message from Alfonso ORN sent at 03/04/2024  2:34 PM EST  Reason for Triage: pt exp  sinus drainage, fatigue worsening,  stuffy nose , achy ,  tested positive for covid . Pt is wanting to know :husband has paxlovid  from 5 days ago and is wanting to know if she could take one of his doses while awaiting a rx    Reason for Disposition  [1] Patient is NOT HIGH RISK AND [2] strongly requests antiviral medicine AND [3] COVID-19 symptoms present < 5 days  Additional Information  Negative: Oxygen level (e.g., pulse oximetry) 90% or lower    No oximeter available.  Negative: Oxygen level (e.g., pulse oximetry) 91 to 94%    Unable to assess.  Does not feel like is low oxygen status.  Answer Assessment - Initial Assessment Questions SYMPTOMS: What is your main symptom or concern? (e.g., cough, fever, shortness of breath, muscle aches)     Sinus congestion, cough, low-grade fever, body aches, stuffy nose, always tired, postnasal drip started last night. Husband also positive for  COVID and was prescribed Paxlovid  which significantly helped.  Patient asking if she can take some of his dose until receiving her own prescription.   ONSET: When did the symptoms start?      Last night  COUGH: Do you have a cough? If Yes, ask: How bad is the cough?       Moderate cough described as a tickle,  can feel in chest, a little tighter than normal, no difficulty breathing  FEVER: Do you have a fever? If Yes, ask: What is your temperature, how was it measured, and when did it start?     Low grad, 99.2 F  BREATHING DIFFICULTY: Are you having any difficulty breathing? (e.g., normal; shortness of breath, wheezing, unable to speak)      Denies  BETTER-SAME-WORSE: Are you getting better, staying the same or getting worse compared to yesterday?  If getting worse, ask, In what way?     Progressing today  OTHER SYMPTOMS: Do you have any other symptoms?  (e.g., chills, fatigue, headache, loss of smell or taste, muscle pain, sore throat)     Sinus drainage is clear becoming yellowish today however.   COVID-19 DIAGNOSIS: How do you know that you have COVID? (e.g., positive lab test or self-test, diagnosed by doctor or NP/PA, symptoms after exposure).     Positive home test  COVID-19 EXPOSURE: Was there any known exposure to COVID before the symptoms began?      Husband also positive  HIGH RISK DISEASE: Do you have any chronic medical problems? (e.g., asthma, heart or lung disease, weak immune system, obesity, etc.)       Denies except history of anxiety- takes 2 medications daily.    O2 SATURATION MONITOR:  Do you use an oxygen saturation monitor (pulse oximeter) at home? If Yes, ask What is your reading (oxygen level) today? What is your usual oxygen saturation  reading? (e.g., 95%)       Unable to assess   EXPOSURES: Has lived in current house for 11 years, no know water leaks, flooding or humidity concerns. No visible mold growth.  Protocols used: COVID-19 - Diagnosed or Suspected-A-AH

## 2024-03-04 NOTE — Telephone Encounter (Deleted)
" ° ° °  Message from Alfonso ORN sent at 03/04/2024  2:34 PM EST  Reason for Triage: pt exp  sinus drainage, fatigue worsening,  stuffy nose , achy ,  tested positive for covid . Pt is wanting to know :husband has paxlovid  from 5 days ago and is wanting to know if she could take one of his doses while awaiting a rx   Answer Assessment - Initial Assessment Questions 1. SYMPTOMS: What is your main symptom or concern? (e.g., cough, fever, shortness of breath, muscle aches)     *No Answer*  2. ONSET: When did the symptoms start?      *No Answer*  3. COUGH: Do you have a cough? If Yes, ask: How bad is the cough?       *No Answer*  4. FEVER: Do you have a fever? If Yes, ask: What is your temperature, how was it measured, and when did it start?     *No Answer*  5. BREATHING DIFFICULTY: Are you having any difficulty breathing? (e.g., normal; shortness of breath, wheezing, unable to speak)      *No Answer*  6. BETTER-SAME-WORSE: Are you getting better, staying the same or getting worse compared to yesterday?  If getting worse, ask, In what way?     *No Answer*  7. OTHER SYMPTOMS: Do you have any other symptoms?  (e.g., chills, fatigue, headache, loss of smell or taste, muscle pain, sore throat)     *No Answer*  8. COVID-19 DIAGNOSIS: How do you know that you have COVID? (e.g., positive lab test or self-test, diagnosed by doctor or NP/PA, symptoms after exposure).     *No Answer*  9. COVID-19 EXPOSURE: Was there any known exposure to COVID before the symptoms began?      *No Answer*  10. COVID-19 VACCINE: Have you had the COVID-19 vaccine? If Yes, ask: When did you last get it?       *No Answer*  11. HIGH RISK DISEASE: Do you have any chronic medical problems? (e.g., asthma, heart or lung disease, weak immune system, obesity, etc.)       *No Answer*  12. PREGNANCY: Is there any chance you are pregnant? When was your last menstrual period?       *No  Answer*  13. O2 SATURATION MONITOR:  Do you use an oxygen saturation monitor (pulse oximeter) at home? If Yes, ask What is your reading (oxygen level) today? What is your usual oxygen saturation reading? (e.g., 95%)       *No Answer*  Protocols used: COVID-19 - Diagnosed or Suspected-A-AH  "

## 2024-03-04 NOTE — Patient Instructions (Signed)
" °  Skylin S Didion, thank you for joining Jon CHRISTELLA Belt, NP for today's virtual visit.  While this provider is not your primary care provider (PCP), if your PCP is located in our provider database this encounter information will be shared with them immediately following your visit.   A [facility] MyChart account gives you access to today's visit and all your visits, tests, and labs performed at [facility]  click here if you don't have a [facility] MyChart account or go to [email or web address]  Consent: (Patient) Cynthia Diaz provided verbal consent for this virtual visit at the beginning of the encounter.  Current Medications:  Current Outpatient Medications:    nirmatrelvir /ritonavir  (PAXLOVID ) 20 x 150 MG & 10 x 100MG  TABS, Take 3 tablets by mouth 2 (two) times daily for 5 days. (Take nirmatrelvir  150 mg two tablets twice daily for 5 days and ritonavir  100 mg one tablet twice daily for 5 days) Patient GFR is 71.24, Disp: 30 tablet, Rfl: 0   ondansetron  (ZOFRAN ) 4 MG tablet, Take 1 tablet (4 mg total) by mouth every 8 (eight) hours as needed for nausea or vomiting., Disp: 20 tablet, Rfl: 0   busPIRone  (BUSPAR ) 10 MG tablet, TAKE 1 AND 1/2 TABLETS BY MOUTH TWICE A DAY FOR ANXIETY., Disp: 270 tablet, Rfl: 2   sertraline  (ZOLOFT ) 100 MG tablet, TAKE 1 TABLET (100 MG TOTAL) BY MOUTH DAILY. FOR ANXIETY, Disp: 90 tablet, Rfl: 2   Medications ordered in this encounter:  Meds ordered this encounter  Medications   nirmatrelvir /ritonavir  (PAXLOVID ) 20 x 150 MG & 10 x 100MG  TABS    Sig: Take 3 tablets by mouth 2 (two) times daily for 5 days. (Take nirmatrelvir  150 mg two tablets twice daily for 5 days and ritonavir  100 mg one tablet twice daily for 5 days) Patient GFR is 71.24    Dispense:  30 tablet    Refill:  0   ondansetron  (ZOFRAN ) 4 MG tablet    Sig: Take 1 tablet (4 mg total) by mouth every 8 (eight) hours as needed for nausea or vomiting.    Dispense:  20 tablet     Refill:  0     *If you need refills on other medications prior to your next appointment, please contact your pharmacy*  Follow-Up: Call back or seek an in-person evaluation if the symptoms worsen or if the condition fails to improve as anticipated.  [facility] [phone]  Other Instructions  Ok to continue taking over the counter medicines to help manage your symptoms.   If you begin feeling severe symptoms, like shortness of breath, please seek emergency care.    If you have been instructed to have an in-person evaluation today at a local Urgent Care facility, please use the link below. It will take you to a list of all of our available [facility], including address, phone number and hours of operation. Please do not delay care.  [facility]  If you or a family member do not have a primary care provider, use the link below to schedule a visit and establish care. When you choose a [facility] primary care physician or advanced practice provider, you gain a long-term partner in health. Find a Primary Care Provider  Learn more about [facility]'s in-office and virtual care options: [facility] - Get Care Now  "

## 2024-03-04 NOTE — Progress Notes (Signed)
 " Virtual Visit Consent   Cynthia Diaz, you are scheduled for a virtual visit with a [facility] provider today. Just as with appointments in the office, your consent must be obtained to participate. Your consent will be active for this visit and any virtual visit you may have with one of our providers in the next 365 days. If you have a MyChart account, a copy of this consent can be sent to you electronically.  As this is a virtual visit, video technology does not allow for your provider to perform a traditional examination. This may limit your provider's ability to fully assess your condition. If your provider identifies any concerns that need to be evaluated in person or the need to arrange testing (such as labs, EKG, etc.), we will make arrangements to do so. Although advances in technology are sophisticated, we cannot ensure that it will always work on either your end or our end. If the connection with a video visit is poor, the visit may have to be switched to a telephone visit. With either a video or telephone visit, we are not always able to ensure that we have a secure connection.  By engaging in this virtual visit, you consent to the provision of healthcare and authorize for your insurance to be billed (if applicable) for the services provided during this visit. Depending on your insurance coverage, you may receive a charge related to this service.  I need to obtain your verbal consent now. Are you willing to proceed with your visit today? Nithya S Saric has provided verbal consent on 03/04/2024 for a virtual visit (video or telephone). Jon CHRISTELLA Belt, NP  Date: 03/04/2024 6:28 PM   Virtual Visit via Video Note   I, Jon CHRISTELLA Belt, connected with  SHERLEY MCKENNEY  ([id number], 1975/10/24) on 03/04/24 at  6:15 PM EST by a video-enabled telemedicine application and verified that I am speaking with the correct person using two identifiers.  Location: Patient: Virtual Visit Location  Patient: Home Provider: Virtual Visit Location Provider: Home Office   I discussed the limitations of evaluation and management by telemedicine and the availability of in person appointments. The patient expressed understanding and agreed to proceed.    History of Present Illness: Cynthia Diaz is a 49 y.o. who identifies as a female who was assigned female at birth, and is being seen today for covid. Tested positive today.   Sore throat, drainage last night, sx worse today. Body aches. Husband and daughter both had it, they vomited with their illnesses, pt slightly nauseated, no vomiting yet. No SOB  Taking robitussin flu and ibuprofen .   Wants paxlovid . 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  Resting comfortably  at home.  Head is normocephalic, atraumatic.  No labored breathing.  Speech is clear and coherent with logical content.  Patient is alert and oriented at baseline.    Assessment and Plan: 1. COVID-19 (Primary) - nirmatrelvir /ritonavir  (PAXLOVID ) 20 x 150 MG &  10 x 100MG  TABS; Take 3 tablets by mouth 2 (two) times daily for 5 days. (Take nirmatrelvir  150 mg two tablets twice daily for 5 days and ritonavir  100 mg one tablet twice daily for 5 days) Patient GFR is 71.24  Dispense: 30 tablet; Refill: 0 - ondansetron  (ZOFRAN ) 4 MG tablet; Take 1 tablet (4 mg total) by mouth every 8 (eight) hours as needed for nausea or  vomiting.  Dispense: 20 tablet; Refill: 0  Through shared decision making, pt elects to take paxlovid . Liver and kidney function were normal approx 7 months, no known liver or renal disesae.   Follow Up Instructions: I discussed the assessment and treatment plan with the patient. The patient was provided an opportunity to ask questions and all were answered. The patient agreed with the plan and demonstrated an understanding of the instructions.  A copy of instructions were sent to the patient via MyChart unless otherwise noted below.   The patient was advised to call back or seek an in-person evaluation if the symptoms worsen or if the condition fails to improve as anticipated.    Jon CHRISTELLA Belt, NP    [1]  Allergies Allergen Reactions   Amoxicillin Hives   Penicillins Hives  [2]  Current Outpatient Medications:    nirmatrelvir /ritonavir  (PAXLOVID ) 20 x 150 MG & 10 x 100MG  TABS, Take 3 tablets by mouth 2 (two) times daily for 5 days. (Take nirmatrelvir  150 mg two tablets twice daily for 5 days and ritonavir  100 mg one tablet twice daily for 5 days) Patient GFR is 71.24, Disp: 30 tablet, Rfl: 0   ondansetron  (ZOFRAN ) 4 MG tablet, Take 1 tablet (4 mg total) by mouth every 8 (eight) hours as needed for nausea or vomiting., Disp: 20 tablet, Rfl: 0   busPIRone  (BUSPAR ) 10 MG tablet, TAKE 1 AND 1/2 TABLETS BY MOUTH TWICE A DAY FOR ANXIETY., Disp: 270 tablet, Rfl: 2   sertraline  (ZOLOFT ) 100 MG tablet, TAKE 1 TABLET (100 MG TOTAL) BY MOUTH DAILY. FOR ANXIETY, Disp: 90 tablet, Rfl: 2  "

## 2024-03-05 NOTE — Telephone Encounter (Signed)
 Patient evaluated.

## 2024-04-22 ENCOUNTER — Ambulatory Visit: Admitting: Physician Assistant
